# Patient Record
Sex: Female | Born: 1945
Health system: Southern US, Community
[De-identification: ages and names within clinical notes are randomized; demographics above are authoritative.]

## PROBLEM LIST (undated history)

## (undated) DIAGNOSIS — N3941 Urge incontinence: Secondary | ICD-10-CM

## (undated) DIAGNOSIS — M1990 Unspecified osteoarthritis, unspecified site: Secondary | ICD-10-CM

## (undated) DIAGNOSIS — E782 Mixed hyperlipidemia: Secondary | ICD-10-CM

## (undated) DIAGNOSIS — D649 Anemia, unspecified: Secondary | ICD-10-CM

## (undated) DIAGNOSIS — R350 Frequency of micturition: Secondary | ICD-10-CM

## (undated) DIAGNOSIS — I499 Cardiac arrhythmia, unspecified: Secondary | ICD-10-CM

## (undated) DIAGNOSIS — I809 Phlebitis and thrombophlebitis of unspecified site: Secondary | ICD-10-CM

## (undated) DIAGNOSIS — I493 Ventricular premature depolarization: Secondary | ICD-10-CM

## (undated) DIAGNOSIS — J329 Chronic sinusitis, unspecified: Secondary | ICD-10-CM

## (undated) DIAGNOSIS — E7800 Pure hypercholesterolemia, unspecified: Secondary | ICD-10-CM

## (undated) DIAGNOSIS — R5383 Other fatigue: Secondary | ICD-10-CM

## (undated) DIAGNOSIS — I498 Other specified cardiac arrhythmias: Secondary | ICD-10-CM

## (undated) DIAGNOSIS — I491 Atrial premature depolarization: Secondary | ICD-10-CM

## (undated) DIAGNOSIS — E669 Obesity, unspecified: Secondary | ICD-10-CM

## (undated) DIAGNOSIS — R002 Palpitations: Secondary | ICD-10-CM

## (undated) DIAGNOSIS — R5381 Other malaise: Secondary | ICD-10-CM

## (undated) DIAGNOSIS — I2089 Other forms of angina pectoris: Secondary | ICD-10-CM

## (undated) DIAGNOSIS — K589 Irritable bowel syndrome without diarrhea: Secondary | ICD-10-CM

## (undated) DIAGNOSIS — E785 Hyperlipidemia, unspecified: Secondary | ICD-10-CM

## (undated) DIAGNOSIS — I10 Essential (primary) hypertension: Secondary | ICD-10-CM

## (undated) DIAGNOSIS — R0600 Dyspnea, unspecified: Secondary | ICD-10-CM

## (undated) DIAGNOSIS — K219 Gastro-esophageal reflux disease without esophagitis: Secondary | ICD-10-CM

## (undated) DIAGNOSIS — I471 Supraventricular tachycardia, unspecified: Secondary | ICD-10-CM

## (undated) HISTORY — PX: BREAST SURGERY: SHX581

## (undated) HISTORY — PX: COLONOSCOPY: SHX174

## (undated) HISTORY — PX: ESOPHAGOGASTRODUODENOSCOPY: SHX1529

## (undated) HISTORY — PX: ABDOMINAL HYSTERECTOMY: SHX81

## (undated) HISTORY — PX: OTHER SURGICAL HISTORY: SHX169

## (undated) HISTORY — PX: APPENDECTOMY: SHX54

---

## 1981-07-17 HISTORY — PX: APPENDECTOMY: SHX54

## 1981-07-17 HISTORY — PX: ABDOMINAL HYSTERECTOMY: SHX81

## 2006-02-08 ENCOUNTER — Ambulatory Visit: Payer: Self-pay | Admitting: Unknown Physician Specialty

## 2006-02-13 ENCOUNTER — Ambulatory Visit: Payer: Self-pay | Admitting: Chiropractic Medicine

## 2008-06-15 ENCOUNTER — Ambulatory Visit: Payer: Self-pay | Admitting: Physician Assistant

## 2008-09-09 ENCOUNTER — Emergency Department: Payer: Self-pay | Admitting: Emergency Medicine

## 2008-10-02 ENCOUNTER — Ambulatory Visit: Payer: Self-pay | Admitting: Unknown Physician Specialty

## 2009-08-17 ENCOUNTER — Ambulatory Visit: Payer: Self-pay | Admitting: Otolaryngology

## 2010-07-19 ENCOUNTER — Ambulatory Visit: Payer: Self-pay | Admitting: Unknown Physician Specialty

## 2011-04-26 ENCOUNTER — Encounter: Payer: Self-pay | Admitting: Urology

## 2011-05-02 ENCOUNTER — Ambulatory Visit: Payer: Self-pay | Admitting: Unknown Physician Specialty

## 2011-05-18 ENCOUNTER — Encounter: Payer: Self-pay | Admitting: Urology

## 2011-06-17 ENCOUNTER — Encounter: Payer: Self-pay | Admitting: Urology

## 2011-07-18 ENCOUNTER — Encounter: Payer: Self-pay | Admitting: Urology

## 2012-04-28 ENCOUNTER — Emergency Department: Payer: Self-pay | Admitting: Emergency Medicine

## 2012-04-28 LAB — COMPREHENSIVE METABOLIC PANEL
Anion Gap: 7 (ref 7–16)
BUN: 17 mg/dL (ref 7–18)
Calcium, Total: 9 mg/dL (ref 8.5–10.1)
Chloride: 110 mmol/L — ABNORMAL HIGH (ref 98–107)
Co2: 27 mmol/L (ref 21–32)
EGFR (African American): >60
EGFR (Non-African Amer.): >60
Osmolality: 288 (ref 275–301)
Potassium: 3.5 mmol/L (ref 3.5–5.1)
SGOT(AST): 24 U/L (ref 15–37)
SGPT (ALT): 20 U/L (ref 12–78)
Sodium: 144 mmol/L (ref 136–145)

## 2012-04-28 LAB — CBC
HCT: 35.4 % (ref 35.0–47.0)
HGB: 12.1 g/dL (ref 12.0–16.0)
MCH: 28.7 pg (ref 26.0–34.0)
MCHC: 34.2 g/dL (ref 32.0–36.0)
Platelet: 223 10*3/uL (ref 150–440)
RBC: 4.22 10*6/uL (ref 3.80–5.20)

## 2012-04-28 LAB — CK TOTAL AND CKMB (NOT AT ARMC): CK-MB: <0.5 ng/mL — ABNORMAL LOW (ref 0.5–3.6)

## 2012-04-28 LAB — TROPONIN I: Troponin-I: <0.02 ng/mL

## 2012-09-06 ENCOUNTER — Ambulatory Visit: Payer: Self-pay | Admitting: Unknown Physician Specialty

## 2012-10-05 HISTORY — PX: EYE SURGERY: SHX253

## 2013-02-05 ENCOUNTER — Ambulatory Visit: Payer: Self-pay | Admitting: Otolaryngology

## 2013-02-24 DIAGNOSIS — R351 Nocturia: Secondary | ICD-10-CM | POA: Insufficient documentation

## 2013-02-24 DIAGNOSIS — R350 Frequency of micturition: Secondary | ICD-10-CM | POA: Insufficient documentation

## 2013-02-24 DIAGNOSIS — N3941 Urge incontinence: Secondary | ICD-10-CM | POA: Insufficient documentation

## 2013-07-17 DIAGNOSIS — I499 Cardiac arrhythmia, unspecified: Secondary | ICD-10-CM

## 2013-07-17 HISTORY — DX: Cardiac arrhythmia, unspecified: I49.9

## 2014-02-17 IMAGING — CT CT PARANASAL SINUSES W/O CM
1 series · 15 of 30 positions shown, 19 images · non-contrast
Comparison: none

REASON FOR EXAM: Chronic Sinusitis  Hx Diploic Bone Abnormality
COMMENTS:

[Series 2: axial facial 2.0 h70h · axial · 0.32mm/px · z∈[+1152,+1288]mm · 15 of 147 slices shown, 19 images]
[im 6/147  brain]
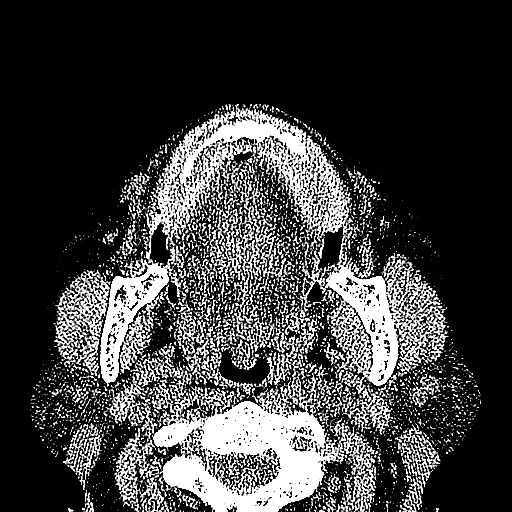
[im 6/147  bone]
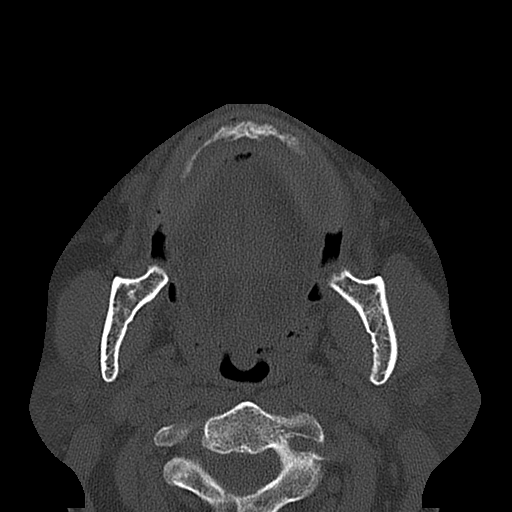
[im 16/147  bone]
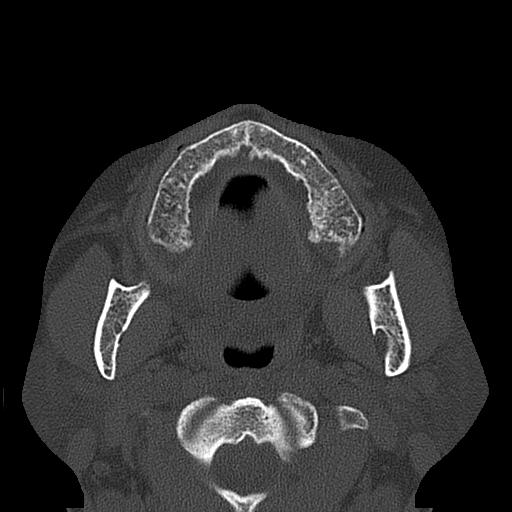
[im 26/147  bone]
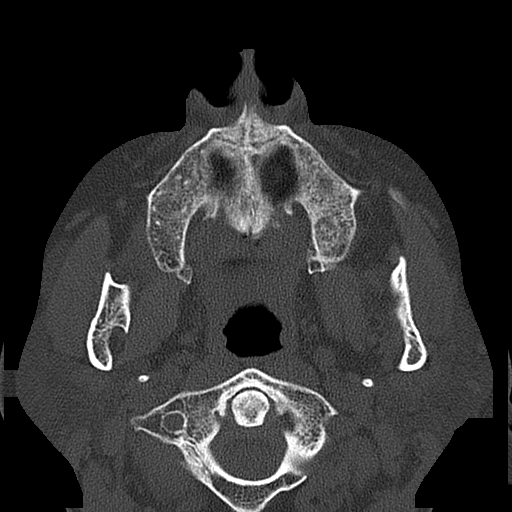
[im 36/147  bone]
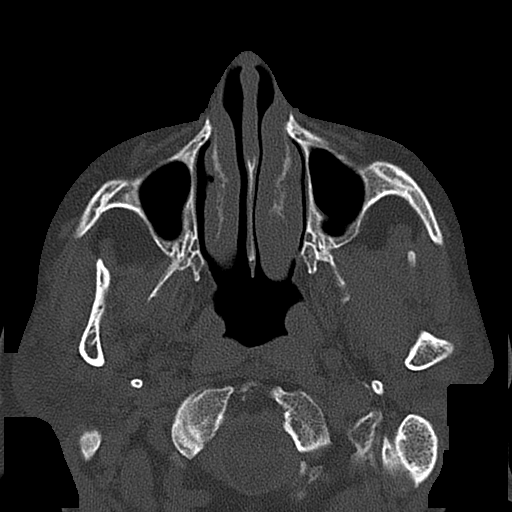
[im 46/147  brain]
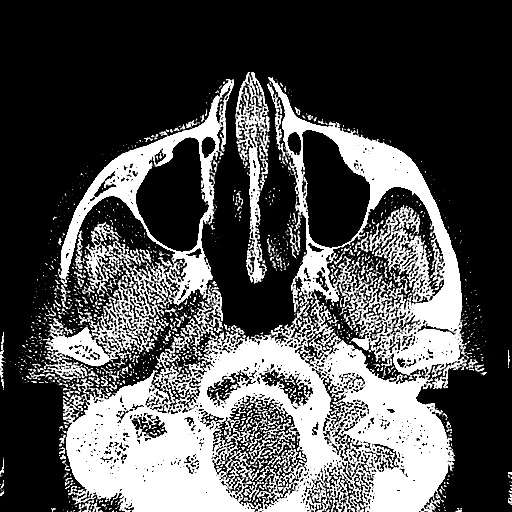
[im 46/147  bone]
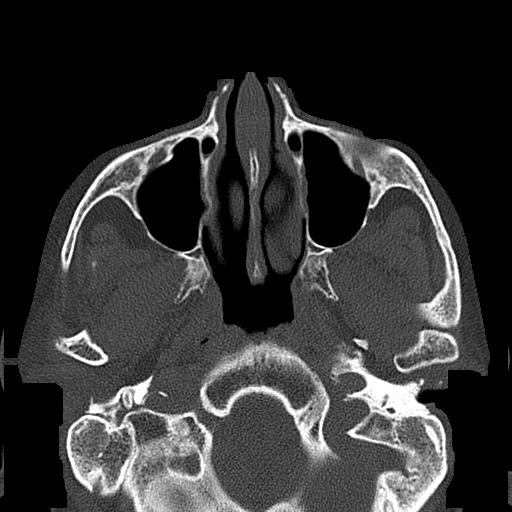
[im 56/147  bone]
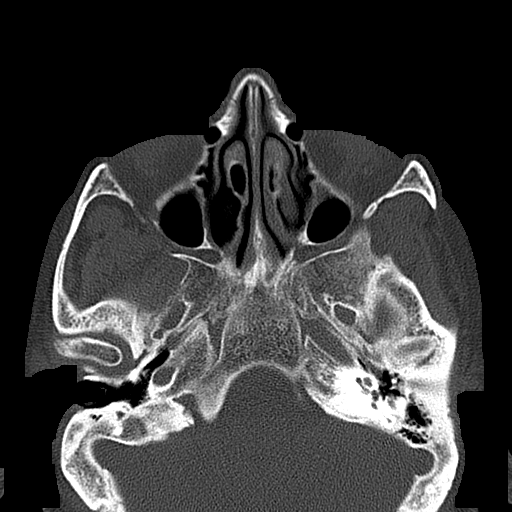
[im 66/147  bone]
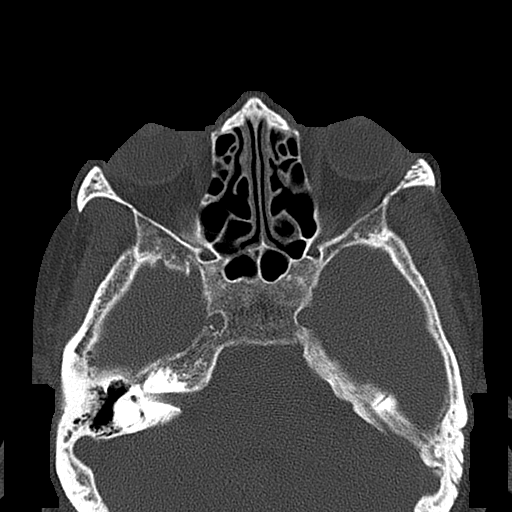
[im 76/147  bone]
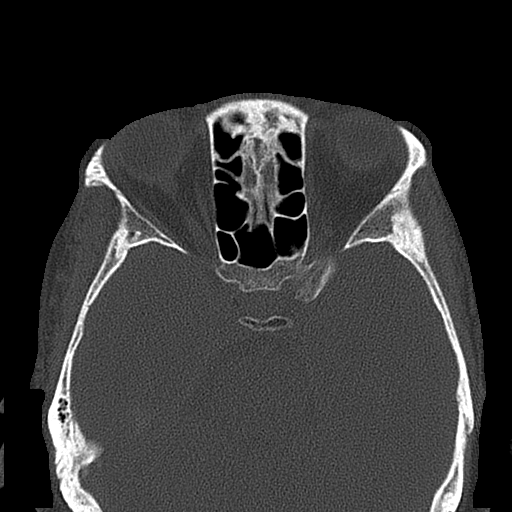
[im 81/147  brain]
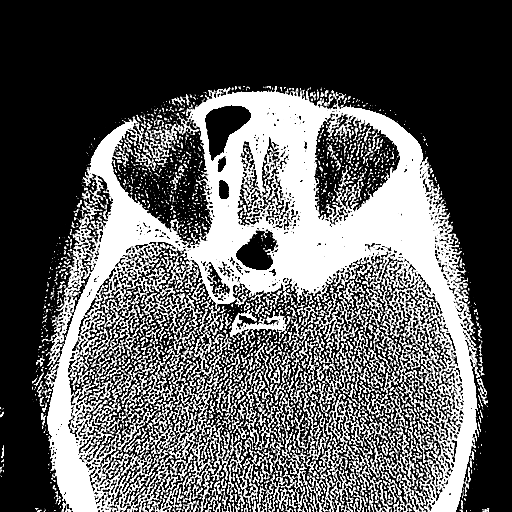
[im 81/147  bone]
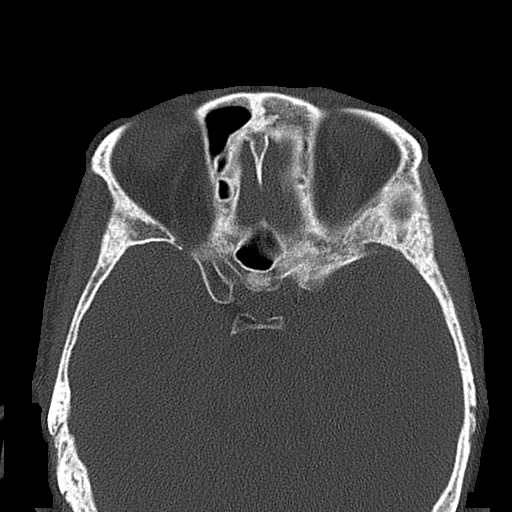
[im 91/147  bone]
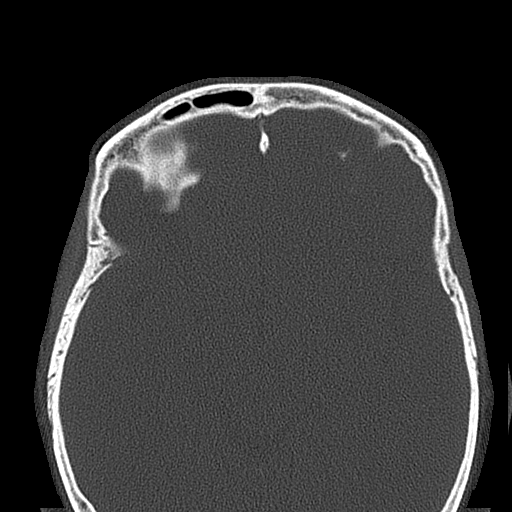
[im 101/147  bone]
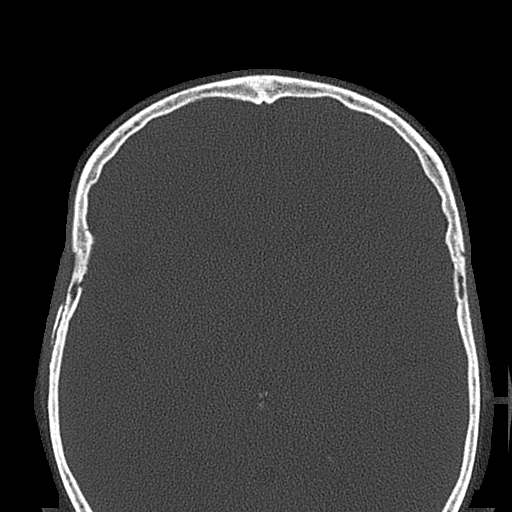
[im 111/147  bone]
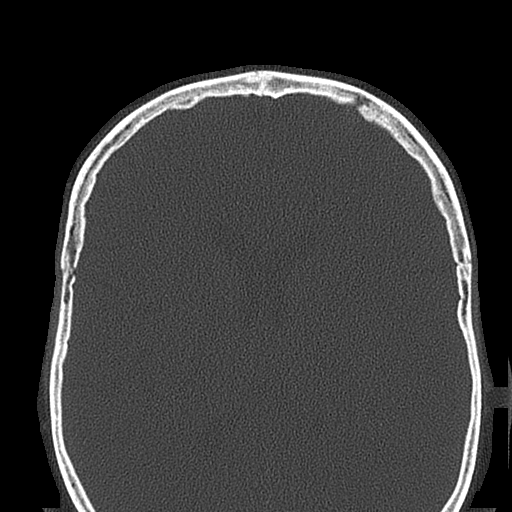
[im 121/147  brain]
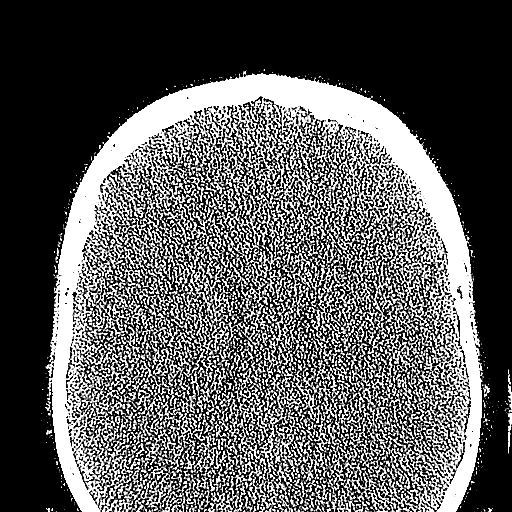
[im 121/147  bone]
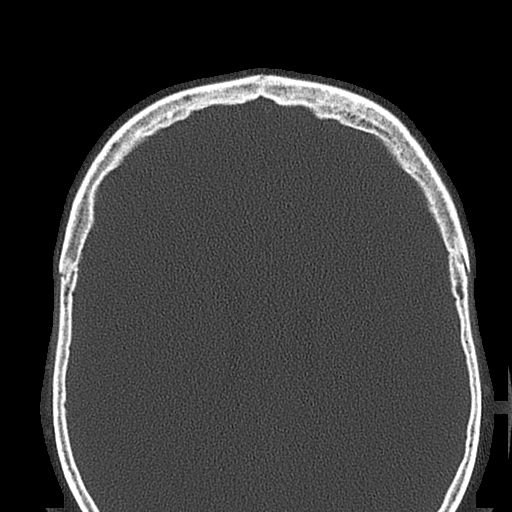
[im 131/147  bone]
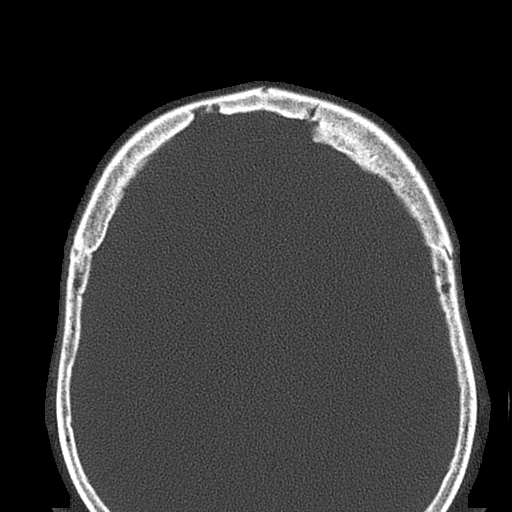
[im 141/147  bone]
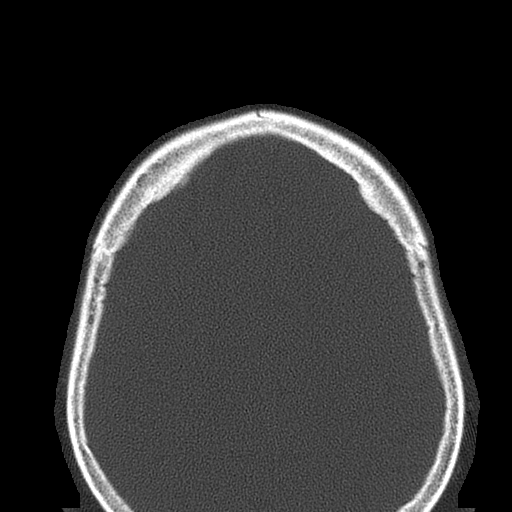

[15 of 30 positions shown; findings below may reference images not displayed]

PROCEDURE:     CT  - CT SINUSES WITHOUT CONTRAST  - February 05, 2013  [DATE]

RESULT:     Axial CT scanning was performed through the paranasal sinuses
with reconstructions at 1 mm intervals and slice thicknesses. Coronal
reconstructions were obtained as well. A bone algorithm was employed.

The left frontal sinus is hypoplastic. The right frontal sinus is clear. The
ethmoid sinuses are well pneumatized. There is a small amount of
mucoperiosteal thickening in posterior ethmoid sinus cells bilaterally. The
maxillary sinuses are clear. The ostiomeatal units are patent bilaterally. A
left sphenoid sinus cell demonstrates a tiny amount of mucoperiosteal
thickening. There are no air fluid levels.

The mastoid air cells are well developed and appear aerated bilaterally. The
middle ear cavities exhibit no abnormal soft tissue densities. There is soft
tissue density material within the right external auditory canal that likely
reflects cerumen.
IMPRESSION: 1. There are no air-fluid levels within the paranasal sinuses.
2. The ostiomeatal units of the maxillary sinuses are patent. The left
frontal sinus is hypoplastic.
3. Small amounts of mucoperiosteal thickening are present in the posterior
ethmoid sinus cells and within a left sphenoid sinus cell.
4. The mastoid air cells and middle ear cavities appear well aerated.

[REDACTED]

## 2015-08-24 DIAGNOSIS — N3281 Overactive bladder: Secondary | ICD-10-CM | POA: Insufficient documentation

## 2016-08-09 ENCOUNTER — Ambulatory Visit: Admit: 2016-08-09 | Payer: Self-pay | Admitting: Unknown Physician Specialty

## 2016-08-09 SURGERY — COLONOSCOPY WITH PROPOFOL
Anesthesia: General

## 2016-10-17 ENCOUNTER — Encounter: Payer: Self-pay | Admitting: *Deleted

## 2016-10-18 ENCOUNTER — Encounter
Admission: RE | Disposition: A | Discharge status: Home / Self Care | Payer: Self-pay | Source: Ambulatory Visit | Attending: Unknown Physician Specialty

## 2016-10-18 ENCOUNTER — Ambulatory Visit: Payer: Medicare Other | Admitting: Anesthesiology

## 2016-10-18 ENCOUNTER — Encounter: Payer: Self-pay | Admitting: Anesthesiology

## 2016-10-18 ENCOUNTER — Ambulatory Visit
Admission: RE | Admit: 2016-10-18 | Discharge: 2016-10-18 | Disposition: A | Discharge status: Home / Self Care | Payer: Medicare Other | Source: Ambulatory Visit | Attending: Unknown Physician Specialty | Admitting: Unknown Physician Specialty

## 2016-10-18 DIAGNOSIS — D122 Benign neoplasm of ascending colon: Secondary | ICD-10-CM | POA: Diagnosis not present

## 2016-10-18 DIAGNOSIS — Z6826 Body mass index (BMI) 26.0-26.9, adult: Secondary | ICD-10-CM | POA: Insufficient documentation

## 2016-10-18 DIAGNOSIS — M1990 Unspecified osteoarthritis, unspecified site: Secondary | ICD-10-CM | POA: Diagnosis not present

## 2016-10-18 DIAGNOSIS — Z8371 Family history of colonic polyps: Secondary | ICD-10-CM | POA: Diagnosis not present

## 2016-10-18 DIAGNOSIS — K219 Gastro-esophageal reflux disease without esophagitis: Secondary | ICD-10-CM | POA: Insufficient documentation

## 2016-10-18 DIAGNOSIS — D649 Anemia, unspecified: Secondary | ICD-10-CM | POA: Diagnosis not present

## 2016-10-18 DIAGNOSIS — D120 Benign neoplasm of cecum: Secondary | ICD-10-CM | POA: Insufficient documentation

## 2016-10-18 DIAGNOSIS — E669 Obesity, unspecified: Secondary | ICD-10-CM | POA: Insufficient documentation

## 2016-10-18 DIAGNOSIS — E7800 Pure hypercholesterolemia, unspecified: Secondary | ICD-10-CM | POA: Diagnosis not present

## 2016-10-18 DIAGNOSIS — Z90710 Acquired absence of both cervix and uterus: Secondary | ICD-10-CM | POA: Diagnosis not present

## 2016-10-18 DIAGNOSIS — E785 Hyperlipidemia, unspecified: Secondary | ICD-10-CM | POA: Insufficient documentation

## 2016-10-18 DIAGNOSIS — Z87891 Personal history of nicotine dependence: Secondary | ICD-10-CM | POA: Diagnosis not present

## 2016-10-18 DIAGNOSIS — Z791 Long term (current) use of non-steroidal anti-inflammatories (NSAID): Secondary | ICD-10-CM | POA: Insufficient documentation

## 2016-10-18 DIAGNOSIS — I499 Cardiac arrhythmia, unspecified: Secondary | ICD-10-CM | POA: Diagnosis not present

## 2016-10-18 DIAGNOSIS — Z1211 Encounter for screening for malignant neoplasm of colon: Secondary | ICD-10-CM | POA: Insufficient documentation

## 2016-10-18 DIAGNOSIS — K640 First degree hemorrhoids: Secondary | ICD-10-CM | POA: Insufficient documentation

## 2016-10-18 DIAGNOSIS — R002 Palpitations: Secondary | ICD-10-CM | POA: Insufficient documentation

## 2016-10-18 DIAGNOSIS — R0600 Dyspnea, unspecified: Secondary | ICD-10-CM | POA: Diagnosis not present

## 2016-10-18 DIAGNOSIS — H409 Unspecified glaucoma: Secondary | ICD-10-CM | POA: Insufficient documentation

## 2016-10-18 DIAGNOSIS — K589 Irritable bowel syndrome without diarrhea: Secondary | ICD-10-CM | POA: Insufficient documentation

## 2016-10-18 HISTORY — DX: Hyperlipidemia, unspecified: E78.5

## 2016-10-18 HISTORY — DX: Irritable bowel syndrome, unspecified: K58.9

## 2016-10-18 HISTORY — DX: Anemia, unspecified: D64.9

## 2016-10-18 HISTORY — DX: Unspecified osteoarthritis, unspecified site: M19.90

## 2016-10-18 HISTORY — DX: Chronic sinusitis, unspecified: J32.9

## 2016-10-18 HISTORY — DX: Pure hypercholesterolemia, unspecified: E78.00

## 2016-10-18 HISTORY — DX: Gastro-esophageal reflux disease without esophagitis: K21.9

## 2016-10-18 HISTORY — DX: Phlebitis and thrombophlebitis of unspecified site: I80.9

## 2016-10-18 HISTORY — DX: Other malaise: R53.81

## 2016-10-18 HISTORY — DX: Cardiac arrhythmia, unspecified: I49.9

## 2016-10-18 HISTORY — PX: COLONOSCOPY WITH PROPOFOL: SHX5780

## 2016-10-18 HISTORY — DX: Dyspnea, unspecified: R06.00

## 2016-10-18 HISTORY — DX: Obesity, unspecified: E66.9

## 2016-10-18 HISTORY — DX: Other malaise: R53.83

## 2016-10-18 SURGERY — COLONOSCOPY WITH PROPOFOL
Anesthesia: General

## 2016-10-18 MED ORDER — PROPOFOL 500 MG/50ML IV EMUL
INTRAVENOUS | Status: DC | PRN
  Administered 2016-10-18: 140 ug/kg/min via INTRAVENOUS

## 2016-10-18 MED ORDER — PROPOFOL 10 MG/ML IV BOLUS
INTRAVENOUS | Status: DC | PRN
  Administered 2016-10-18: 80 mg via INTRAVENOUS
  Administered 2016-10-18 (×2): 30 mg via INTRAVENOUS

## 2016-10-18 MED ORDER — SODIUM CHLORIDE 0.9 % IV SOLN: INTRAVENOUS | Status: DC

## 2016-10-18 MED ORDER — SODIUM CHLORIDE 0.9 % IV SOLN
INTRAVENOUS | Status: DC
  Administered 2016-10-18: 1000 mL via INTRAVENOUS

## 2016-10-18 MED ORDER — LIDOCAINE HCL (PF) 1 % IJ SOLN
2.0000 mL | Freq: Once | INTRAMUSCULAR | Status: AC
  Administered 2016-10-18: 0.3 mL via INTRADERMAL

## 2016-10-18 MED ORDER — PROPOFOL 500 MG/50ML IV EMUL
INTRAVENOUS | Status: AC
  Filled 2016-10-18: qty 50

## 2016-10-18 NOTE — Transfer of Care (Signed)
Immediate Anesthesia Transfer of Care Note  Patient: Kaitlyn Ramirez  Procedure(s) Performed: Procedure(s): COLONOSCOPY WITH PROPOFOL (N/A)  Patient Location: Endoscopy Unit  Anesthesia Type:General  Level of Consciousness: sedated  Airway & Oxygen Therapy: Patient Spontanous Breathing and Patient connected to nasal cannula oxygen  Post-op Assessment: Report given to RN and Post -op Vital signs reviewed and stable  Post vital signs: Reviewed and stable  Last Vitals:  Vitals:   10/18/16 1041  BP: 139/65  Pulse: 63  Resp: 17  Temp: (!) 35.7 C    Last Pain:  Vitals:   10/18/16 1041  TempSrc: Tympanic         Complications: No apparent anesthesia complications

## 2016-10-18 NOTE — Op Note (Signed)
[facility] Gastroenterology Patient Name: Kaitlyn Ramirez Procedure Date: 10/18/2016 11:35 AM MRN: [id number] Account #: [id number] Date of Birth: 1946/03/03 Admit Type: Outpatient Age: 71 Room: [facility] ENDO ROOM 3 Gender: Female Note Status: Finalized Procedure:            Colonoscopy Indications:          Colon cancer screening in patient at increased risk:                        Family history of 1st-degree relative with colon polyps Providers:            Manya Silvas, MD Referring MD:         Rusty Aus, MD (Referring MD) Medicines:            Propofol per Anesthesia Complications:        No immediate complications. Procedure:            Pre-Anesthesia Assessment:                       - After reviewing the risks and benefits, the patient                        was deemed in satisfactory condition to undergo the                        procedure.                       After obtaining informed consent, the colonoscope was                        passed under direct vision. Throughout the procedure,                        the patient's blood pressure, pulse, and oxygen                        saturations were monitored continuously. The                        Colonoscope was introduced through the anus and                        advanced to the the cecum, identified by appendiceal                        orifice and ileocecal valve. The colonoscopy was                        performed without difficulty. The patient tolerated the                        procedure well. The quality of the bowel preparation                        was good. Findings:      A diminutive polyp was found in the cecum. The polyp was sessile. The       polyp was removed with a cold snare. Resection and retrieval were       complete.  A diminutive polyp was found in the proximal ascending colon. The polyp       was sessile. The polyp was removed with a jumbo cold forceps. Resection        and retrieval were complete.      Internal hemorrhoids were found during endoscopy. The hemorrhoids were       small and Grade I (internal hemorrhoids that do not prolapse).      The exam was otherwise without abnormality. Impression:           - One diminutive polyp in the cecum, removed with a                        cold snare. Resected and retrieved.                       - One diminutive polyp in the proximal ascending colon,                        removed with a jumbo cold forceps. Resected and                        retrieved.                       - Internal hemorrhoids.                       - The examination was otherwise normal. Recommendation:       - Await pathology results. Manya Silvas, MD 10/18/2016 12:10:06 PM This report has been signed electronically. Number of Addenda: 0 Note Initiated On: 10/18/2016 11:35 AM Scope Withdrawal Time: 0 hours 13 minutes 33 seconds  Total Procedure Duration: 0 hours 18 minutes 23 seconds       [facility]

## 2016-10-18 NOTE — Anesthesia Preprocedure Evaluation (Signed)
Anesthesia Evaluation  Patient identified by MRN, date of birth, ID band Patient awake    Reviewed: Allergy & Precautions, H&P , NPO status , Patient's Chart, lab work & pertinent test results  History of Anesthesia Complications Negative for: history of anesthetic complications  Airway Mallampati: III  TM Distance: >3 FB Neck ROM: limited    Dental  (+) Poor Dentition, Chipped, Missing, Upper Dentures, Partial Lower   Pulmonary neg pulmonary ROS, neg shortness of breath, former smoker,    Pulmonary exam normal breath sounds clear to auscultation       Cardiovascular Exercise Tolerance: Good (-) angina(-) Past MI and (-) DOE Normal cardiovascular exam+ dysrhythmias  Rhythm:regular Rate:Normal     Neuro/Psych negative neurological ROS  negative psych ROS   GI/Hepatic negative GI ROS, Neg liver ROS, GERD  Controlled and Medicated,  Endo/Other  negative endocrine ROS  Renal/GU negative Renal ROS  negative genitourinary   Musculoskeletal  (+) Arthritis ,   Abdominal   Peds  Hematology negative hematology ROS (+)   Anesthesia Other Findings Past Medical History: No date: Anemia No date: Arthritis No date: Dyspnea 2015: Dysrhythmia     Comment: heart palpatations, irregular rhythm No date: GERD (gastroesophageal reflux disease) No date: Hypercholesteremia No date: Hyperlipidemia No date: Irritable bowel No date: Malaise and fatigue No date: Obesity No date: Phlebitis No date: Sinusitis  Past Surgical History: No date: ABDOMINAL HYSTERECTOMY No date: BREAST SURGERY     Comment: breast biopsies No date: COLONOSCOPY No date: ESOPHAGOGASTRODUODENOSCOPY 10/05/2012: EYE SURGERY Bilateral     Comment: iridectomy peripheral for glaucoma  BMI    Body Mass Index:  26.61 kg/m      Reproductive/Obstetrics negative OB ROS                             Anesthesia Physical Anesthesia  Plan  ASA: III  Anesthesia Plan: General   Post-op Pain Management:    Induction:   Airway Management Planned:   Additional Equipment:   Intra-op Plan:   Post-operative Plan:   Informed Consent: I have reviewed the patients History and Physical, chart, labs and discussed the procedure including the risks, benefits and alternatives for the proposed anesthesia with the patient or authorized representative who has indicated his/her understanding and acceptance.   Dental Advisory Given  Plan Discussed with: Anesthesiologist, CRNA and Surgeon  Anesthesia Plan Comments:         Anesthesia Quick Evaluation

## 2016-10-18 NOTE — Anesthesia Post-op Follow-up Note (Signed)
Anesthesia QCDR form completed.        

## 2016-10-18 NOTE — Anesthesia Postprocedure Evaluation (Signed)
Anesthesia Post Note  Patient: Kaitlyn Ramirez  Procedure(s) Performed: Procedure(s) (LRB): COLONOSCOPY WITH PROPOFOL (N/A)  Patient location during evaluation: Endoscopy Anesthesia Type: General Level of consciousness: awake and alert Pain management: pain level controlled Vital Signs Assessment: post-procedure vital signs reviewed and stable Respiratory status: spontaneous breathing, nonlabored ventilation, respiratory function stable and patient connected to nasal cannula oxygen Cardiovascular status: blood pressure returned to baseline and stable Postop Assessment: no signs of nausea or vomiting Anesthetic complications: no     Last Vitals:  Vitals:   10/18/16 1244 10/18/16 1254  BP: 137/83 (!) 176/69  Pulse: (!) 52 62  Resp: 16 15  Temp:      Last Pain:  Vitals:   10/18/16 1214  TempSrc: Tympanic  PainSc: Asleep                 Precious Haws Farrell Pantaleo

## 2016-10-18 NOTE — H&P (Signed)
Primary Care Physician:  Rusty Aus, MD Primary Gastroenterologist:  Dr. Vira Agar  Pre-Procedure History & Physical: HPI:  Kaitlyn Ramirez is a 71 y.o. female is here for an colonoscopy.   Past Medical History:  Diagnosis Date  . Anemia   . Arthritis   . Dyspnea   . Dysrhythmia 2015   heart palpatations, irregular rhythm  . GERD (gastroesophageal reflux disease)   . Hypercholesteremia   . Hyperlipidemia   . Irritable bowel   . Malaise and fatigue   . Obesity   . Phlebitis   . Sinusitis     Past Surgical History:  Procedure Laterality Date  . ABDOMINAL HYSTERECTOMY    . BREAST SURGERY     breast biopsies  . COLONOSCOPY    . ESOPHAGOGASTRODUODENOSCOPY    . EYE SURGERY Bilateral 10/05/2012   iridectomy peripheral for glaucoma    Prior to Admission medications   Medication Sig Start Date End Date Taking? Authorizing Provider  Artificial Tear Solution (TEARS NATURALE FORTE OP) Apply 1 drop to eye every 4 (four) hours as needed (for dry eyes).   Yes Historical Provider, MD  calcium-vitamin D 250-100 MG-UNIT tablet Take 1 tablet by mouth 2 (two) times daily.   Yes Historical Provider, MD  ibuprofen (ADVIL,MOTRIN) 200 MG tablet Take 400 mg by mouth every evening.   Yes Historical Provider, MD  LACTOBACILLUS RHAMNOSUS, GG, PO Take by mouth daily.   Yes Historical Provider, MD  latanoprost (XALATAN) 0.005 % ophthalmic solution Place 1 drop into both eyes at bedtime.   Yes Historical Provider, MD  loratadine (CLARITIN) 10 MG tablet Take 10 mg by mouth daily.   Yes Historical Provider, MD  montelukast (SINGULAIR) 10 MG tablet Take 10 mg by mouth daily.   Yes Historical Provider, MD  OMEGA-3 FATTY ACIDS PO Take 1 capsule by mouth daily. 340-1,000mg    Yes Historical Provider, MD  omeprazole (PRILOSEC) 40 MG capsule Take 40 mg by mouth daily.   Yes Historical Provider, MD  pravastatin (PRAVACHOL) 10 MG tablet Take 10 mg by mouth every evening.   Yes Historical Provider, MD     Allergies as of 09/07/2016  . (Not on File)    History reviewed. No pertinent family history.  Social History   Social History  . Marital status: Married    Spouse name: N/A  . Number of children: N/A  . Years of education: N/A   Occupational History  . Not on file.   Social History Main Topics  . Smoking status: Former Research scientist (life sciences)  . Smokeless tobacco: Never Used  . Alcohol use No  . Drug use: No  . Sexual activity: Not on file   Other Topics Concern  . Not on file   Social History Narrative  . No narrative on file    Review of Systems: See HPI, otherwise negative ROS  Physical Exam: BP 139/65   Pulse 63   Temp (!) 96.3 F (35.7 C) (Tympanic)   Resp 17   Ht 5\' 8"  (1.727 m)   Wt 79.4 kg (175 lb)   SpO2 100%   BMI 26.61 kg/m  General:   Alert,  pleasant and cooperative in NAD Head:  Normocephalic and atraumatic. Neck:  Supple; no masses or thyromegaly. Lungs:  Clear throughout to auscultation.    Heart:  Regular rate and rhythm. Abdomen:  Soft, nontender and nondistended. Normal bowel sounds, without guarding, and without rebound.   Neurologic:  Alert and  oriented x4;  grossly normal  neurologically.  Impression/Plan: Kaitlyn Ramirez is here for an colonoscopy to be performed for FH colon polyps  Risks, benefits, limitations, and alternatives regarding  colonoscopy have been reviewed with the patient.  Questions have been answered.  All parties agreeable.   Gaylyn Cheers, MD  10/18/2016, 11:41 AM

## 2016-10-19 ENCOUNTER — Encounter: Payer: Self-pay | Admitting: Unknown Physician Specialty

## 2016-10-19 LAB — SURGICAL PATHOLOGY

## 2016-12-25 DIAGNOSIS — D369 Benign neoplasm, unspecified site: Secondary | ICD-10-CM | POA: Insufficient documentation

## 2017-08-16 DIAGNOSIS — R152 Fecal urgency: Secondary | ICD-10-CM | POA: Diagnosis not present

## 2017-08-16 DIAGNOSIS — R159 Full incontinence of feces: Secondary | ICD-10-CM | POA: Diagnosis not present

## 2017-08-16 DIAGNOSIS — N3281 Overactive bladder: Secondary | ICD-10-CM | POA: Diagnosis not present

## 2017-09-03 DIAGNOSIS — R5381 Other malaise: Secondary | ICD-10-CM | POA: Diagnosis not present

## 2017-09-03 DIAGNOSIS — J329 Chronic sinusitis, unspecified: Secondary | ICD-10-CM | POA: Diagnosis not present

## 2017-09-03 DIAGNOSIS — K219 Gastro-esophageal reflux disease without esophagitis: Secondary | ICD-10-CM | POA: Diagnosis not present

## 2017-09-03 DIAGNOSIS — R0602 Shortness of breath: Secondary | ICD-10-CM | POA: Diagnosis not present

## 2017-09-03 DIAGNOSIS — R011 Cardiac murmur, unspecified: Secondary | ICD-10-CM | POA: Diagnosis not present

## 2017-09-03 DIAGNOSIS — R002 Palpitations: Secondary | ICD-10-CM | POA: Diagnosis not present

## 2017-09-03 DIAGNOSIS — I208 Other forms of angina pectoris: Secondary | ICD-10-CM | POA: Diagnosis not present

## 2017-09-03 DIAGNOSIS — E785 Hyperlipidemia, unspecified: Secondary | ICD-10-CM | POA: Diagnosis not present

## 2017-09-03 DIAGNOSIS — R5383 Other fatigue: Secondary | ICD-10-CM | POA: Diagnosis not present

## 2017-09-18 DIAGNOSIS — H04123 Dry eye syndrome of bilateral lacrimal glands: Secondary | ICD-10-CM | POA: Diagnosis not present

## 2017-09-18 DIAGNOSIS — H2513 Age-related nuclear cataract, bilateral: Secondary | ICD-10-CM | POA: Diagnosis not present

## 2017-09-18 DIAGNOSIS — H40033 Anatomical narrow angle, bilateral: Secondary | ICD-10-CM | POA: Diagnosis not present

## 2017-09-28 DIAGNOSIS — D171 Benign lipomatous neoplasm of skin and subcutaneous tissue of trunk: Secondary | ICD-10-CM | POA: Diagnosis not present

## 2017-09-28 DIAGNOSIS — L239 Allergic contact dermatitis, unspecified cause: Secondary | ICD-10-CM | POA: Diagnosis not present

## 2017-10-08 DIAGNOSIS — R011 Cardiac murmur, unspecified: Secondary | ICD-10-CM | POA: Diagnosis not present

## 2017-10-08 DIAGNOSIS — I208 Other forms of angina pectoris: Secondary | ICD-10-CM | POA: Diagnosis not present

## 2017-10-12 DIAGNOSIS — Z23 Encounter for immunization: Secondary | ICD-10-CM | POA: Diagnosis not present

## 2017-10-16 DIAGNOSIS — I10 Essential (primary) hypertension: Secondary | ICD-10-CM | POA: Diagnosis not present

## 2017-10-24 DIAGNOSIS — Z1231 Encounter for screening mammogram for malignant neoplasm of breast: Secondary | ICD-10-CM | POA: Diagnosis not present

## 2017-10-24 DIAGNOSIS — Z124 Encounter for screening for malignant neoplasm of cervix: Secondary | ICD-10-CM | POA: Diagnosis not present

## 2017-12-27 DIAGNOSIS — D225 Melanocytic nevi of trunk: Secondary | ICD-10-CM | POA: Diagnosis not present

## 2017-12-27 DIAGNOSIS — E782 Mixed hyperlipidemia: Secondary | ICD-10-CM | POA: Diagnosis not present

## 2017-12-27 DIAGNOSIS — D171 Benign lipomatous neoplasm of skin and subcutaneous tissue of trunk: Secondary | ICD-10-CM | POA: Diagnosis not present

## 2018-01-07 DIAGNOSIS — Z0000 Encounter for general adult medical examination without abnormal findings: Secondary | ICD-10-CM | POA: Diagnosis not present

## 2018-01-07 DIAGNOSIS — I10 Essential (primary) hypertension: Secondary | ICD-10-CM | POA: Diagnosis not present

## 2018-01-07 DIAGNOSIS — E782 Mixed hyperlipidemia: Secondary | ICD-10-CM | POA: Diagnosis not present

## 2018-01-07 DIAGNOSIS — Z79899 Other long term (current) drug therapy: Secondary | ICD-10-CM | POA: Diagnosis not present

## 2018-01-07 DIAGNOSIS — D500 Iron deficiency anemia secondary to blood loss (chronic): Secondary | ICD-10-CM | POA: Diagnosis not present

## 2018-01-19 DIAGNOSIS — H6980 Other specified disorders of Eustachian tube, unspecified ear: Secondary | ICD-10-CM | POA: Diagnosis not present

## 2018-01-19 DIAGNOSIS — D500 Iron deficiency anemia secondary to blood loss (chronic): Secondary | ICD-10-CM | POA: Diagnosis not present

## 2018-01-21 DIAGNOSIS — D500 Iron deficiency anemia secondary to blood loss (chronic): Secondary | ICD-10-CM | POA: Diagnosis not present

## 2018-02-04 DIAGNOSIS — E782 Mixed hyperlipidemia: Secondary | ICD-10-CM | POA: Diagnosis not present

## 2018-02-04 DIAGNOSIS — Z79899 Other long term (current) drug therapy: Secondary | ICD-10-CM | POA: Diagnosis not present

## 2018-02-04 DIAGNOSIS — D500 Iron deficiency anemia secondary to blood loss (chronic): Secondary | ICD-10-CM | POA: Diagnosis not present

## 2018-02-05 DIAGNOSIS — H04123 Dry eye syndrome of bilateral lacrimal glands: Secondary | ICD-10-CM | POA: Diagnosis not present

## 2018-02-05 DIAGNOSIS — H40033 Anatomical narrow angle, bilateral: Secondary | ICD-10-CM | POA: Diagnosis not present

## 2018-02-05 DIAGNOSIS — H2513 Age-related nuclear cataract, bilateral: Secondary | ICD-10-CM | POA: Diagnosis not present

## 2018-02-06 DIAGNOSIS — D171 Benign lipomatous neoplasm of skin and subcutaneous tissue of trunk: Secondary | ICD-10-CM | POA: Diagnosis not present

## 2018-02-06 DIAGNOSIS — E782 Mixed hyperlipidemia: Secondary | ICD-10-CM | POA: Diagnosis not present

## 2018-02-06 DIAGNOSIS — I10 Essential (primary) hypertension: Secondary | ICD-10-CM | POA: Diagnosis not present

## 2018-02-06 DIAGNOSIS — Z79899 Other long term (current) drug therapy: Secondary | ICD-10-CM | POA: Diagnosis not present

## 2018-02-12 DIAGNOSIS — D171 Benign lipomatous neoplasm of skin and subcutaneous tissue of trunk: Secondary | ICD-10-CM | POA: Diagnosis not present

## 2018-02-20 ENCOUNTER — Encounter: Payer: Self-pay | Admitting: *Deleted

## 2018-02-26 ENCOUNTER — Ambulatory Visit: Payer: Self-pay | Admitting: General Surgery

## 2018-03-20 DIAGNOSIS — R070 Pain in throat: Secondary | ICD-10-CM | POA: Diagnosis not present

## 2018-03-20 DIAGNOSIS — J309 Allergic rhinitis, unspecified: Secondary | ICD-10-CM | POA: Diagnosis not present

## 2018-03-20 DIAGNOSIS — H6980 Other specified disorders of Eustachian tube, unspecified ear: Secondary | ICD-10-CM | POA: Diagnosis not present

## 2018-03-22 DIAGNOSIS — J0100 Acute maxillary sinusitis, unspecified: Secondary | ICD-10-CM | POA: Diagnosis not present

## 2018-03-22 DIAGNOSIS — R600 Localized edema: Secondary | ICD-10-CM | POA: Diagnosis not present

## 2018-04-10 DIAGNOSIS — Z1231 Encounter for screening mammogram for malignant neoplasm of breast: Secondary | ICD-10-CM | POA: Diagnosis not present

## 2018-04-24 ENCOUNTER — Encounter: Payer: Self-pay | Admitting: *Deleted

## 2018-04-25 ENCOUNTER — Ambulatory Visit: Payer: Medicare HMO | Admitting: General Surgery

## 2018-05-02 DIAGNOSIS — Z23 Encounter for immunization: Secondary | ICD-10-CM | POA: Diagnosis not present

## 2018-06-25 DIAGNOSIS — H40033 Anatomical narrow angle, bilateral: Secondary | ICD-10-CM | POA: Diagnosis not present

## 2018-06-25 DIAGNOSIS — H2513 Age-related nuclear cataract, bilateral: Secondary | ICD-10-CM | POA: Diagnosis not present

## 2018-06-25 DIAGNOSIS — H04123 Dry eye syndrome of bilateral lacrimal glands: Secondary | ICD-10-CM | POA: Diagnosis not present

## 2018-07-18 ENCOUNTER — Encounter: Payer: Self-pay | Admitting: *Deleted

## 2018-08-06 DIAGNOSIS — Z79899 Other long term (current) drug therapy: Secondary | ICD-10-CM | POA: Diagnosis not present

## 2018-08-06 DIAGNOSIS — E782 Mixed hyperlipidemia: Secondary | ICD-10-CM | POA: Diagnosis not present

## 2018-08-13 DIAGNOSIS — Z0000 Encounter for general adult medical examination without abnormal findings: Secondary | ICD-10-CM | POA: Diagnosis not present

## 2018-08-13 DIAGNOSIS — K219 Gastro-esophageal reflux disease without esophagitis: Secondary | ICD-10-CM | POA: Diagnosis not present

## 2018-08-13 DIAGNOSIS — E782 Mixed hyperlipidemia: Secondary | ICD-10-CM | POA: Diagnosis not present

## 2018-08-20 DIAGNOSIS — R011 Cardiac murmur, unspecified: Secondary | ICD-10-CM | POA: Diagnosis not present

## 2018-08-20 DIAGNOSIS — R5383 Other fatigue: Secondary | ICD-10-CM | POA: Diagnosis not present

## 2018-08-20 DIAGNOSIS — R5381 Other malaise: Secondary | ICD-10-CM | POA: Diagnosis not present

## 2018-08-20 DIAGNOSIS — J329 Chronic sinusitis, unspecified: Secondary | ICD-10-CM | POA: Diagnosis not present

## 2018-08-20 DIAGNOSIS — R0602 Shortness of breath: Secondary | ICD-10-CM | POA: Diagnosis not present

## 2018-08-20 DIAGNOSIS — R002 Palpitations: Secondary | ICD-10-CM | POA: Diagnosis not present

## 2018-08-20 DIAGNOSIS — E785 Hyperlipidemia, unspecified: Secondary | ICD-10-CM | POA: Diagnosis not present

## 2018-08-20 DIAGNOSIS — I208 Other forms of angina pectoris: Secondary | ICD-10-CM | POA: Diagnosis not present

## 2018-08-20 DIAGNOSIS — K219 Gastro-esophageal reflux disease without esophagitis: Secondary | ICD-10-CM | POA: Diagnosis not present

## 2018-09-05 ENCOUNTER — Ambulatory Visit: Payer: Medicare HMO | Admitting: General Surgery

## 2018-09-05 ENCOUNTER — Encounter: Payer: Self-pay | Admitting: General Surgery

## 2018-09-05 ENCOUNTER — Other Ambulatory Visit: Payer: Self-pay

## 2018-09-05 VITALS — BP 150/81 | HR 74 | Temp 97.7°F | Ht 68.0 in | Wt 171.0 lb

## 2018-09-05 DIAGNOSIS — D171 Benign lipomatous neoplasm of skin and subcutaneous tissue of trunk: Secondary | ICD-10-CM | POA: Diagnosis not present

## 2018-09-05 NOTE — Progress Notes (Signed)
Patient ID: Kaitlyn Ramirez, female   DOB: 16-Nov-1945, 73 y.o.   MRN: [id number]  Chief Complaint  Patient presents with  . Lipoma     eval lipoma on back    HPI Kaitlyn Ramirez is a 73 y.o. female here today for lipoma of the back patient states it is not painful. States that her bra strap rubs up against it and it is bothersome, she has had it removed before many years ago. Patient states she is feeling well otherwise.   HPI  Past Medical History:  Diagnosis Date  . Anemia   . Arthritis   . Dyspnea   . Dysrhythmia 2015   heart palpatations, irregular rhythm  . GERD (gastroesophageal reflux disease)   . Hypercholesteremia   . Hyperlipidemia   . Irritable bowel   . Malaise and fatigue   . Obesity   . Phlebitis   . Sinusitis     Past Surgical History:  Procedure Laterality Date  . ABDOMINAL HYSTERECTOMY    . BREAST SURGERY     breast biopsies  . COLONOSCOPY    . COLONOSCOPY WITH PROPOFOL N/A 10/18/2016   Procedure: COLONOSCOPY WITH PROPOFOL;  Surgeon: Manya Silvas, MD;  Location: [facility] ENDOSCOPY;  Service: Endoscopy;  Laterality: N/A;  . ESOPHAGOGASTRODUODENOSCOPY    . EYE SURGERY Bilateral 10/05/2012   iridectomy peripheral for glaucoma    Family History  Problem Relation Age of Onset  . Cancer Mother     Social History Social History   Tobacco Use  . Smoking status: Former Research scientist (life sciences)  . Smokeless tobacco: Never Used  Substance Use Topics  . Alcohol use: No  . Drug use: No    Allergies  Allergen Reactions  . Amoxicillin Nausea And Vomiting  . Sulfa Antibiotics     Current Outpatient Medications  Medication Sig Dispense Refill  . amLODipine (NORVASC) 5 MG tablet     . Artificial Tear Solution (TEARS NATURALE FORTE OP) Apply 1 drop to eye every 4 (four) hours as needed (for dry eyes).    . Calcium Carbonate-Vitamin D (CALCIUM HIGH POTENCY/VITAMIN D) 600-200 MG-UNIT TABS Take by mouth.    . calcium-vitamin D 250-100 MG-UNIT tablet Take 1 tablet by mouth 2  (two) times daily.    . cefUROXime (CEFTIN) 250 MG tablet     . ibuprofen (ADVIL,MOTRIN) 200 MG tablet Take 400 mg by mouth every evening.    Marland Kitchen LACTOBACILLUS RHAMNOSUS, GG, PO Take by mouth daily.    Marland Kitchen latanoprost (XALATAN) 0.005 % ophthalmic solution Place 1 drop into both eyes at bedtime.    Marland Kitchen loratadine (CLARITIN) 10 MG tablet Take 10 mg by mouth daily.    . OMEGA-3 FATTY ACIDS PO Take 1 capsule by mouth daily. 340-1,000mg     . omeprazole (PRILOSEC) 40 MG capsule Take 40 mg by mouth daily.    . pravastatin (PRAVACHOL) 10 MG tablet Take 10 mg by mouth every evening.     No current facility-administered medications for this visit.     Review of Systems Review of Systems  Constitutional: Negative.   Respiratory: Negative.   Cardiovascular: Negative.     Blood pressure (!) 150/81, pulse 74, temperature 97.7 F (36.5 C), temperature source Temporal, height 5\' 8"  (1.727 m), weight 171 lb (77.6 kg), SpO2 94 %.  Physical Exam Physical Exam Constitutional:      Appearance: She is well-developed.  Eyes:     General: No scleral icterus.    Conjunctiva/sclera: Conjunctivae normal.  Neck:  Musculoskeletal: Normal range of motion.  Cardiovascular:     Rate and Rhythm: Normal rate and regular rhythm.     Heart sounds: Normal heart sounds.  Pulmonary:     Effort: Pulmonary effort is normal.     Breath sounds: Normal breath sounds.    Chest:     Breasts: Breasts are symmetrical.        Right: No inverted nipple, mass, nipple discharge, skin change or tenderness.        Left: No inverted nipple, mass, nipple discharge, skin change or tenderness.  Lymphadenopathy:     Cervical: No cervical adenopathy.  Skin:    General: Skin is warm and dry.  Neurological:     Mental Status: She is alert and oriented to person, place, and time.     Data Reviewed The patient reports this last was excised in 2013 and recurred within the last 12 months.  Records from that visit were not  available today.  Records from her 2005 excision of a 2 cm mass with pathology showing a simple lipoma were available.  Office notes from that date reference a prior excision in 2002.  Paper charts from that date are no longer available.  Assessment Recurrent lipoma of the right back, irritating to the location along the paraspinal muscle and aggravated by the band of her bra.   Plan  At this time, we will make arrangements to resect this at a convenient date as an office procedure.  HPI, Physical Exam, Assessment and Plan have been scribed under the direction and in the presence of Hervey Ard, Md.  Eudelia Bunch R. Bobette Mo, CMA Marlane Hatcher 09/05/2018, 2:08 PM

## 2018-09-05 NOTE — Patient Instructions (Addendum)
Patient will need to be scheduled to have lipoma of the back removed.   Call the office with any questions or concerns.

## 2018-09-19 ENCOUNTER — Other Ambulatory Visit: Payer: Self-pay

## 2018-09-19 ENCOUNTER — Encounter: Payer: Self-pay | Admitting: General Surgery

## 2018-09-19 ENCOUNTER — Ambulatory Visit: Payer: Medicare HMO | Admitting: General Surgery

## 2018-09-19 VITALS — BP 148/81 | HR 75 | Temp 97.3°F | Resp 16 | Ht 68.0 in | Wt 169.2 lb

## 2018-09-19 DIAGNOSIS — D171 Benign lipomatous neoplasm of skin and subcutaneous tissue of trunk: Secondary | ICD-10-CM

## 2018-09-19 HISTORY — PX: LIPOMA EXCISION: SHX5283

## 2018-09-19 NOTE — Patient Instructions (Signed)
May shower May remove dressing in 2-3 days Steri strips will gradually come off over 2-3 weeks May use an Ice pack as needed for comfort  

## 2018-09-19 NOTE — Progress Notes (Signed)
Patient ID: Kaitlyn Ramirez, female   DOB: 03-Nov-1945, 73 y.o.   MRN: [id number]  Chief Complaint  Patient presents with  . Procedure    HPI NANEA Kaitlyn Ramirez is a 73 y.o. female.  Here for excision back mass.  HPI  Past Medical History:  Diagnosis Date  . Anemia   . Arthritis   . Dyspnea   . Dysrhythmia 2015   heart palpatations, irregular rhythm  . GERD (gastroesophageal reflux disease)   . Hypercholesteremia   . Hyperlipidemia   . Irritable bowel   . Malaise and fatigue   . Obesity   . Phlebitis   . Sinusitis     Past Surgical History:  Procedure Laterality Date  . ABDOMINAL HYSTERECTOMY    . BREAST SURGERY     breast biopsies  . COLONOSCOPY    . COLONOSCOPY WITH PROPOFOL N/A 10/18/2016   Procedure: COLONOSCOPY WITH PROPOFOL;  Surgeon: Manya Silvas, MD;  Location: [facility] ENDOSCOPY;  Service: Endoscopy;  Laterality: N/A;  . ESOPHAGOGASTRODUODENOSCOPY    . EYE SURGERY Bilateral 10/05/2012   iridectomy peripheral for glaucoma  . LIPOMA EXCISION  09/19/2018   Back    Family History  Problem Relation Age of Onset  . Cancer Mother     Social History Social History   Tobacco Use  . Smoking status: Former Research scientist (life sciences)  . Smokeless tobacco: Never Used  Substance Use Topics  . Alcohol use: No  . Drug use: No    Allergies  Allergen Reactions  . Amoxicillin Nausea And Vomiting  . Sulfa Antibiotics     Current Outpatient Medications  Medication Sig Dispense Refill  . amLODipine (NORVASC) 5 MG tablet     . Artificial Tear Solution (TEARS NATURALE FORTE OP) Apply 1 drop to eye every 4 (four) hours as needed (for dry eyes).    . Calcium Carbonate-Vitamin D (CALCIUM HIGH POTENCY/VITAMIN D) 600-200 MG-UNIT TABS Take by mouth.    . calcium-vitamin D 250-100 MG-UNIT tablet Take 1 tablet by mouth 2 (two) times daily.    . cefUROXime (CEFTIN) 250 MG tablet     . ibuprofen (ADVIL,MOTRIN) 200 MG tablet Take 400 mg by mouth every evening.    Marland Kitchen LACTOBACILLUS RHAMNOSUS, GG, PO  Take by mouth daily.    Marland Kitchen latanoprost (XALATAN) 0.005 % ophthalmic solution Place 1 drop into both eyes at bedtime.    Marland Kitchen loratadine (CLARITIN) 10 MG tablet Take 10 mg by mouth daily.    . OMEGA-3 FATTY ACIDS PO Take 1 capsule by mouth daily. 340-1,000mg     . omeprazole (PRILOSEC) 40 MG capsule Take 40 mg by mouth daily.    . pravastatin (PRAVACHOL) 10 MG tablet Take 10 mg by mouth every evening.     No current facility-administered medications for this visit.     Review of Systems Review of Systems  Constitutional: Negative.   Respiratory: Negative.   Cardiovascular: Negative.     Blood pressure (!) 148/81, pulse 75, temperature (!) 97.3 F (36.3 C), temperature source Skin, resp. rate 16, height 5\' 8"  (1.727 m), weight 169 lb 3.2 oz (76.7 kg), SpO2 97 %.  Physical Exam Physical Exam Constitutional:      Appearance: Normal appearance.  Pulmonary:    Skin:    General: Skin is warm and dry.  Neurological:     Mental Status: She is alert and oriented to person, place, and time.  Psychiatric:        Behavior: Behavior normal.     Data  Reviewed The procedure was reviewed and she was amenable to proceed. 20 cc of 0.%% xylocaine with 0.25 % marcaine with 1: 200,000 units of epinephrine which was well tolerated. The area was excised through a transverse incision. The dissection was carried down to but did not violate the deep fascia.  Deep tissue was approximated  With 3-0 vicryl figure of eight sutures.  The skin was approximated with a running subcuticular suture.  Benzoin and steristrips followed by Telfa and Tegaderm applied.   Assessment Recurrent lipoma of the back.   Plan Tylenol for discomfort.  Ice for comfort. You will be called with pathology when available.     HPI, assessment, plan and physical exam has been scribed under the direction and in the presence of Robert Bellow, MD. Karie Fetch, RN  I have completed the exam and reviewed the above  documentation for accuracy and completeness.  I agree with the above.  Haematologist has been used and any errors in dictation or transcription are unintentional.  Hervey Ard, M.D., F.A.C.S.   Forest Gleason Mariyam Remington 09/19/2018, 9:11 PM

## 2018-09-23 ENCOUNTER — Telehealth: Payer: Self-pay

## 2018-09-23 NOTE — Telephone Encounter (Signed)
-----   Message from Robert Bellow, MD sent at 09/23/2018 11:38 AM EDT ----- Please notify the patient that the pathology showed fatty tissue as expected. Thanks ----- Message ----- From: Interface, Lab In [facility] Sent: 09/20/2018   5:37 PM EDT To: Robert Bellow, MD

## 2018-09-23 NOTE — Progress Notes (Signed)
Patient has been informed of her pathology results per Dr.Brynett.

## 2018-12-31 DIAGNOSIS — H40033 Anatomical narrow angle, bilateral: Secondary | ICD-10-CM | POA: Diagnosis not present

## 2018-12-31 DIAGNOSIS — H2513 Age-related nuclear cataract, bilateral: Secondary | ICD-10-CM | POA: Diagnosis not present

## 2018-12-31 DIAGNOSIS — H04123 Dry eye syndrome of bilateral lacrimal glands: Secondary | ICD-10-CM | POA: Diagnosis not present

## 2019-01-14 DIAGNOSIS — Z20828 Contact with and (suspected) exposure to other viral communicable diseases: Secondary | ICD-10-CM | POA: Diagnosis not present

## 2019-02-06 DIAGNOSIS — E782 Mixed hyperlipidemia: Secondary | ICD-10-CM | POA: Diagnosis not present

## 2019-02-13 DIAGNOSIS — Z79899 Other long term (current) drug therapy: Secondary | ICD-10-CM | POA: Diagnosis not present

## 2019-02-13 DIAGNOSIS — E782 Mixed hyperlipidemia: Secondary | ICD-10-CM | POA: Diagnosis not present

## 2019-02-13 DIAGNOSIS — I493 Ventricular premature depolarization: Secondary | ICD-10-CM | POA: Diagnosis not present

## 2019-02-26 ENCOUNTER — Telehealth: Payer: Self-pay | Admitting: *Deleted

## 2019-02-26 NOTE — Telephone Encounter (Signed)
Patient called the office wanting to get a print out of record showing that she made a $45.00 co-pay from [place] 09-05-18 and 09-19-18.   The patient would like this to be mailed to her.   Note routed to Angie to please print and mail since I don't have the access to do so.

## 2019-04-16 DIAGNOSIS — Z1231 Encounter for screening mammogram for malignant neoplasm of breast: Secondary | ICD-10-CM | POA: Diagnosis not present

## 2019-05-02 DIAGNOSIS — Z23 Encounter for immunization: Secondary | ICD-10-CM | POA: Diagnosis not present

## 2019-07-30 DIAGNOSIS — M1611 Unilateral primary osteoarthritis, right hip: Secondary | ICD-10-CM | POA: Diagnosis not present

## 2019-07-30 DIAGNOSIS — I10 Essential (primary) hypertension: Secondary | ICD-10-CM | POA: Diagnosis not present

## 2019-07-30 DIAGNOSIS — Z87891 Personal history of nicotine dependence: Secondary | ICD-10-CM | POA: Diagnosis not present

## 2019-08-14 ENCOUNTER — Ambulatory Visit: Payer: Self-pay

## 2019-08-14 DIAGNOSIS — E782 Mixed hyperlipidemia: Secondary | ICD-10-CM | POA: Diagnosis not present

## 2019-08-21 ENCOUNTER — Ambulatory Visit: Payer: Medicare HMO | Attending: Internal Medicine

## 2019-08-21 DIAGNOSIS — Z23 Encounter for immunization: Secondary | ICD-10-CM

## 2019-08-21 NOTE — Progress Notes (Signed)
   Covid-19 Vaccination Clinic  Name:  Kaitlyn Ramirez    MRN: [id number] DOB: 1946/07/14  08/21/2019  [name] was observed post Covid-19 immunization for 15 minutes without incidence. She was provided with Vaccine Information Sheet and instruction to access the V-Safe system.   [name] was instructed to call 911 with any severe reactions post vaccine: Marland Kitchen Difficulty breathing  . Swelling of your face and throat  . A fast heartbeat  . A bad rash all over your body  . Dizziness and weakness    Immunizations Administered    Name Date Dose VIS Date Route   Pfizer COVID-19 Vaccine 08/21/2019  9:23 AM 0.3 mL 06/27/2019 Intramuscular   Manufacturer: [place]   Lot: [id number]   [place]: [id number]

## 2019-08-25 ENCOUNTER — Ambulatory Visit: Payer: Self-pay

## 2019-08-26 DIAGNOSIS — M25551 Pain in right hip: Secondary | ICD-10-CM | POA: Diagnosis not present

## 2019-08-26 DIAGNOSIS — K2970 Gastritis, unspecified, without bleeding: Secondary | ICD-10-CM | POA: Diagnosis not present

## 2019-08-26 DIAGNOSIS — I10 Essential (primary) hypertension: Secondary | ICD-10-CM | POA: Diagnosis not present

## 2019-08-26 DIAGNOSIS — Z0000 Encounter for general adult medical examination without abnormal findings: Secondary | ICD-10-CM | POA: Diagnosis not present

## 2019-08-27 DIAGNOSIS — L821 Other seborrheic keratosis: Secondary | ICD-10-CM | POA: Diagnosis not present

## 2019-08-27 DIAGNOSIS — D225 Melanocytic nevi of trunk: Secondary | ICD-10-CM | POA: Diagnosis not present

## 2019-09-05 DIAGNOSIS — M1611 Unilateral primary osteoarthritis, right hip: Secondary | ICD-10-CM | POA: Diagnosis not present

## 2019-09-10 DIAGNOSIS — M25551 Pain in right hip: Secondary | ICD-10-CM | POA: Diagnosis not present

## 2019-09-10 DIAGNOSIS — G8929 Other chronic pain: Secondary | ICD-10-CM | POA: Diagnosis not present

## 2019-09-10 DIAGNOSIS — M25651 Stiffness of right hip, not elsewhere classified: Secondary | ICD-10-CM | POA: Diagnosis not present

## 2019-09-10 DIAGNOSIS — M6281 Muscle weakness (generalized): Secondary | ICD-10-CM | POA: Diagnosis not present

## 2019-09-15 ENCOUNTER — Ambulatory Visit: Payer: Medicare HMO | Attending: Internal Medicine

## 2019-09-15 DIAGNOSIS — Z23 Encounter for immunization: Secondary | ICD-10-CM | POA: Insufficient documentation

## 2019-09-15 NOTE — Progress Notes (Signed)
   Covid-19 Vaccination Clinic  Name:  Kaitlyn Ramirez    MRN: [id number] DOB: 10/28/1945  09/15/2019  Kaitlyn Ramirez was observed post Covid-19 immunization for 15 minutes without incidence. She was provided with Vaccine Information Sheet and instruction to access the V-Safe system.   Kaitlyn Ramirez was instructed to call 911 with any severe reactions post vaccine: Marland Kitchen Difficulty breathing  . Swelling of your face and throat  . A fast heartbeat  . A bad rash all over your body  . Dizziness and weakness    Immunizations Administered    Name Date Dose VIS Date Route   Pfizer COVID-19 Vaccine 09/15/2019  2:19 PM 0.3 mL 06/27/2019 Intramuscular   Manufacturer: [employer]   Lot: [id number]   [place]: [id number]

## 2019-09-23 DIAGNOSIS — G8929 Other chronic pain: Secondary | ICD-10-CM | POA: Diagnosis not present

## 2019-09-23 DIAGNOSIS — M25551 Pain in right hip: Secondary | ICD-10-CM | POA: Diagnosis not present

## 2019-09-25 DIAGNOSIS — G8929 Other chronic pain: Secondary | ICD-10-CM | POA: Diagnosis not present

## 2019-09-25 DIAGNOSIS — M25551 Pain in right hip: Secondary | ICD-10-CM | POA: Diagnosis not present

## 2019-09-30 DIAGNOSIS — M25551 Pain in right hip: Secondary | ICD-10-CM | POA: Diagnosis not present

## 2019-09-30 DIAGNOSIS — G8929 Other chronic pain: Secondary | ICD-10-CM | POA: Diagnosis not present

## 2019-10-02 DIAGNOSIS — M25551 Pain in right hip: Secondary | ICD-10-CM | POA: Diagnosis not present

## 2019-10-02 DIAGNOSIS — G8929 Other chronic pain: Secondary | ICD-10-CM | POA: Diagnosis not present

## 2019-10-06 DIAGNOSIS — M25551 Pain in right hip: Secondary | ICD-10-CM | POA: Diagnosis not present

## 2019-10-06 DIAGNOSIS — G8929 Other chronic pain: Secondary | ICD-10-CM | POA: Diagnosis not present

## 2019-10-09 DIAGNOSIS — M25551 Pain in right hip: Secondary | ICD-10-CM | POA: Diagnosis not present

## 2019-10-09 DIAGNOSIS — G8929 Other chronic pain: Secondary | ICD-10-CM | POA: Diagnosis not present

## 2019-10-13 DIAGNOSIS — G8929 Other chronic pain: Secondary | ICD-10-CM | POA: Diagnosis not present

## 2019-10-13 DIAGNOSIS — M25551 Pain in right hip: Secondary | ICD-10-CM | POA: Diagnosis not present

## 2019-10-14 DIAGNOSIS — H40033 Anatomical narrow angle, bilateral: Secondary | ICD-10-CM | POA: Diagnosis not present

## 2019-10-14 DIAGNOSIS — H2513 Age-related nuclear cataract, bilateral: Secondary | ICD-10-CM | POA: Diagnosis not present

## 2019-10-15 DIAGNOSIS — G8929 Other chronic pain: Secondary | ICD-10-CM | POA: Diagnosis not present

## 2019-10-15 DIAGNOSIS — M25551 Pain in right hip: Secondary | ICD-10-CM | POA: Diagnosis not present

## 2019-10-21 DIAGNOSIS — M25551 Pain in right hip: Secondary | ICD-10-CM | POA: Diagnosis not present

## 2019-10-21 DIAGNOSIS — G8929 Other chronic pain: Secondary | ICD-10-CM | POA: Diagnosis not present

## 2019-10-24 DIAGNOSIS — M25551 Pain in right hip: Secondary | ICD-10-CM | POA: Diagnosis not present

## 2019-10-24 DIAGNOSIS — G8929 Other chronic pain: Secondary | ICD-10-CM | POA: Diagnosis not present

## 2019-11-03 DIAGNOSIS — M1611 Unilateral primary osteoarthritis, right hip: Secondary | ICD-10-CM | POA: Diagnosis not present

## 2019-11-04 DIAGNOSIS — G8929 Other chronic pain: Secondary | ICD-10-CM | POA: Diagnosis not present

## 2019-11-04 DIAGNOSIS — M25551 Pain in right hip: Secondary | ICD-10-CM | POA: Diagnosis not present

## 2019-11-12 DIAGNOSIS — M25551 Pain in right hip: Secondary | ICD-10-CM | POA: Diagnosis not present

## 2019-11-12 DIAGNOSIS — G8929 Other chronic pain: Secondary | ICD-10-CM | POA: Diagnosis not present

## 2019-11-20 DIAGNOSIS — M25551 Pain in right hip: Secondary | ICD-10-CM | POA: Diagnosis not present

## 2019-11-20 DIAGNOSIS — G8929 Other chronic pain: Secondary | ICD-10-CM | POA: Diagnosis not present

## 2019-11-26 DIAGNOSIS — M25551 Pain in right hip: Secondary | ICD-10-CM | POA: Diagnosis not present

## 2019-11-26 DIAGNOSIS — G8929 Other chronic pain: Secondary | ICD-10-CM | POA: Diagnosis not present

## 2019-12-10 DIAGNOSIS — G8929 Other chronic pain: Secondary | ICD-10-CM | POA: Diagnosis not present

## 2019-12-10 DIAGNOSIS — M25551 Pain in right hip: Secondary | ICD-10-CM | POA: Diagnosis not present

## 2020-02-16 DIAGNOSIS — E782 Mixed hyperlipidemia: Secondary | ICD-10-CM | POA: Diagnosis not present

## 2020-02-23 DIAGNOSIS — E785 Hyperlipidemia, unspecified: Secondary | ICD-10-CM | POA: Diagnosis not present

## 2020-04-20 DIAGNOSIS — Z1231 Encounter for screening mammogram for malignant neoplasm of breast: Secondary | ICD-10-CM | POA: Diagnosis not present

## 2020-07-24 DIAGNOSIS — Z1152 Encounter for screening for COVID-19: Secondary | ICD-10-CM | POA: Diagnosis not present

## 2020-07-27 DIAGNOSIS — H2513 Age-related nuclear cataract, bilateral: Secondary | ICD-10-CM | POA: Diagnosis not present

## 2020-07-27 DIAGNOSIS — H40033 Anatomical narrow angle, bilateral: Secondary | ICD-10-CM | POA: Diagnosis not present

## 2020-08-25 DIAGNOSIS — E782 Mixed hyperlipidemia: Secondary | ICD-10-CM | POA: Diagnosis not present

## 2020-08-26 DIAGNOSIS — I10 Essential (primary) hypertension: Secondary | ICD-10-CM | POA: Diagnosis not present

## 2020-08-26 DIAGNOSIS — K219 Gastro-esophageal reflux disease without esophagitis: Secondary | ICD-10-CM | POA: Diagnosis not present

## 2020-08-26 DIAGNOSIS — I498 Other specified cardiac arrhythmias: Secondary | ICD-10-CM | POA: Diagnosis not present

## 2020-08-26 DIAGNOSIS — I4892 Unspecified atrial flutter: Secondary | ICD-10-CM | POA: Diagnosis not present

## 2020-08-26 DIAGNOSIS — I493 Ventricular premature depolarization: Secondary | ICD-10-CM | POA: Diagnosis not present

## 2020-08-26 DIAGNOSIS — E782 Mixed hyperlipidemia: Secondary | ICD-10-CM | POA: Diagnosis not present

## 2020-08-26 DIAGNOSIS — R002 Palpitations: Secondary | ICD-10-CM | POA: Diagnosis not present

## 2020-08-26 DIAGNOSIS — I471 Supraventricular tachycardia: Secondary | ICD-10-CM | POA: Diagnosis not present

## 2020-08-26 DIAGNOSIS — E079 Disorder of thyroid, unspecified: Secondary | ICD-10-CM | POA: Diagnosis not present

## 2020-09-01 DIAGNOSIS — R739 Hyperglycemia, unspecified: Secondary | ICD-10-CM | POA: Diagnosis not present

## 2020-09-01 DIAGNOSIS — Z0000 Encounter for general adult medical examination without abnormal findings: Secondary | ICD-10-CM | POA: Diagnosis not present

## 2020-09-01 DIAGNOSIS — Z23 Encounter for immunization: Secondary | ICD-10-CM | POA: Diagnosis not present

## 2020-09-23 DIAGNOSIS — K219 Gastro-esophageal reflux disease without esophagitis: Secondary | ICD-10-CM | POA: Diagnosis not present

## 2020-09-23 DIAGNOSIS — I498 Other specified cardiac arrhythmias: Secondary | ICD-10-CM | POA: Diagnosis not present

## 2020-09-23 DIAGNOSIS — I471 Supraventricular tachycardia: Secondary | ICD-10-CM | POA: Diagnosis not present

## 2020-09-23 DIAGNOSIS — I10 Essential (primary) hypertension: Secondary | ICD-10-CM | POA: Diagnosis not present

## 2020-09-23 DIAGNOSIS — E079 Disorder of thyroid, unspecified: Secondary | ICD-10-CM | POA: Diagnosis not present

## 2020-09-23 DIAGNOSIS — E782 Mixed hyperlipidemia: Secondary | ICD-10-CM | POA: Diagnosis not present

## 2020-10-20 DIAGNOSIS — K219 Gastro-esophageal reflux disease without esophagitis: Secondary | ICD-10-CM | POA: Diagnosis not present

## 2020-10-20 DIAGNOSIS — R140 Abdominal distension (gaseous): Secondary | ICD-10-CM | POA: Diagnosis not present

## 2020-10-20 DIAGNOSIS — R1013 Epigastric pain: Secondary | ICD-10-CM | POA: Diagnosis not present

## 2020-11-27 DIAGNOSIS — J0100 Acute maxillary sinusitis, unspecified: Secondary | ICD-10-CM | POA: Diagnosis not present

## 2020-12-16 DIAGNOSIS — I493 Ventricular premature depolarization: Secondary | ICD-10-CM | POA: Diagnosis not present

## 2020-12-16 DIAGNOSIS — I208 Other forms of angina pectoris: Secondary | ICD-10-CM | POA: Diagnosis not present

## 2020-12-16 DIAGNOSIS — K219 Gastro-esophageal reflux disease without esophagitis: Secondary | ICD-10-CM | POA: Diagnosis not present

## 2020-12-16 DIAGNOSIS — I471 Supraventricular tachycardia: Secondary | ICD-10-CM | POA: Diagnosis not present

## 2020-12-16 DIAGNOSIS — I10 Essential (primary) hypertension: Secondary | ICD-10-CM | POA: Diagnosis not present

## 2020-12-16 DIAGNOSIS — R002 Palpitations: Secondary | ICD-10-CM | POA: Diagnosis not present

## 2020-12-16 DIAGNOSIS — E079 Disorder of thyroid, unspecified: Secondary | ICD-10-CM | POA: Diagnosis not present

## 2020-12-16 DIAGNOSIS — E782 Mixed hyperlipidemia: Secondary | ICD-10-CM | POA: Diagnosis not present

## 2020-12-16 DIAGNOSIS — I498 Other specified cardiac arrhythmias: Secondary | ICD-10-CM | POA: Diagnosis not present

## 2020-12-28 ENCOUNTER — Ambulatory Visit: Admit: 2020-12-28 | Payer: Medicare HMO

## 2020-12-28 SURGERY — ESOPHAGOGASTRODUODENOSCOPY (EGD) WITH PROPOFOL
Anesthesia: General

## 2021-01-18 DIAGNOSIS — R002 Palpitations: Secondary | ICD-10-CM | POA: Diagnosis not present

## 2021-01-18 DIAGNOSIS — I208 Other forms of angina pectoris: Secondary | ICD-10-CM | POA: Diagnosis not present

## 2021-01-31 DIAGNOSIS — I10 Essential (primary) hypertension: Secondary | ICD-10-CM | POA: Diagnosis not present

## 2021-01-31 DIAGNOSIS — I498 Other specified cardiac arrhythmias: Secondary | ICD-10-CM | POA: Diagnosis not present

## 2021-01-31 DIAGNOSIS — I491 Atrial premature depolarization: Secondary | ICD-10-CM | POA: Diagnosis not present

## 2021-01-31 DIAGNOSIS — K219 Gastro-esophageal reflux disease without esophagitis: Secondary | ICD-10-CM | POA: Diagnosis not present

## 2021-01-31 DIAGNOSIS — I493 Ventricular premature depolarization: Secondary | ICD-10-CM | POA: Diagnosis not present

## 2021-01-31 DIAGNOSIS — E782 Mixed hyperlipidemia: Secondary | ICD-10-CM | POA: Diagnosis not present

## 2021-01-31 DIAGNOSIS — R002 Palpitations: Secondary | ICD-10-CM | POA: Diagnosis not present

## 2021-02-15 DIAGNOSIS — Z124 Encounter for screening for malignant neoplasm of cervix: Secondary | ICD-10-CM | POA: Diagnosis not present

## 2021-02-22 DIAGNOSIS — E782 Mixed hyperlipidemia: Secondary | ICD-10-CM | POA: Diagnosis not present

## 2021-02-22 DIAGNOSIS — E538 Deficiency of other specified B group vitamins: Secondary | ICD-10-CM | POA: Diagnosis not present

## 2021-03-01 DIAGNOSIS — G4710 Hypersomnia, unspecified: Secondary | ICD-10-CM | POA: Diagnosis not present

## 2021-04-26 DIAGNOSIS — Z1231 Encounter for screening mammogram for malignant neoplasm of breast: Secondary | ICD-10-CM | POA: Diagnosis not present

## 2021-04-28 DIAGNOSIS — H2513 Age-related nuclear cataract, bilateral: Secondary | ICD-10-CM | POA: Diagnosis not present

## 2021-04-28 DIAGNOSIS — H40033 Anatomical narrow angle, bilateral: Secondary | ICD-10-CM | POA: Diagnosis not present

## 2021-06-02 DIAGNOSIS — G4710 Hypersomnia, unspecified: Secondary | ICD-10-CM | POA: Diagnosis not present

## 2021-06-05 DIAGNOSIS — G4733 Obstructive sleep apnea (adult) (pediatric): Secondary | ICD-10-CM | POA: Diagnosis not present

## 2021-08-03 DIAGNOSIS — I371 Nonrheumatic pulmonary valve insufficiency: Secondary | ICD-10-CM | POA: Diagnosis not present

## 2021-08-03 DIAGNOSIS — K219 Gastro-esophageal reflux disease without esophagitis: Secondary | ICD-10-CM | POA: Diagnosis not present

## 2021-08-03 DIAGNOSIS — E039 Hypothyroidism, unspecified: Secondary | ICD-10-CM | POA: Diagnosis not present

## 2021-08-03 DIAGNOSIS — M170 Bilateral primary osteoarthritis of knee: Secondary | ICD-10-CM | POA: Diagnosis not present

## 2021-08-03 DIAGNOSIS — Z20822 Contact with and (suspected) exposure to covid-19: Secondary | ICD-10-CM | POA: Diagnosis not present

## 2021-08-03 DIAGNOSIS — I10 Essential (primary) hypertension: Secondary | ICD-10-CM | POA: Diagnosis not present

## 2021-08-03 DIAGNOSIS — H2511 Age-related nuclear cataract, right eye: Secondary | ICD-10-CM | POA: Diagnosis not present

## 2021-08-03 DIAGNOSIS — M160 Bilateral primary osteoarthritis of hip: Secondary | ICD-10-CM | POA: Diagnosis not present

## 2021-08-03 DIAGNOSIS — J45909 Unspecified asthma, uncomplicated: Secondary | ICD-10-CM | POA: Diagnosis not present

## 2021-08-16 DIAGNOSIS — H35351 Cystoid macular degeneration, right eye: Secondary | ICD-10-CM | POA: Diagnosis not present

## 2021-08-16 DIAGNOSIS — Z961 Presence of intraocular lens: Secondary | ICD-10-CM | POA: Diagnosis not present

## 2021-08-31 DIAGNOSIS — R140 Abdominal distension (gaseous): Secondary | ICD-10-CM | POA: Diagnosis not present

## 2021-08-31 DIAGNOSIS — K219 Gastro-esophageal reflux disease without esophagitis: Secondary | ICD-10-CM | POA: Diagnosis not present

## 2021-08-31 DIAGNOSIS — Z86010 Personal history of colonic polyps: Secondary | ICD-10-CM | POA: Diagnosis not present

## 2021-09-02 DIAGNOSIS — E538 Deficiency of other specified B group vitamins: Secondary | ICD-10-CM | POA: Diagnosis not present

## 2021-09-02 DIAGNOSIS — Z79899 Other long term (current) drug therapy: Secondary | ICD-10-CM | POA: Diagnosis not present

## 2021-09-02 DIAGNOSIS — E782 Mixed hyperlipidemia: Secondary | ICD-10-CM | POA: Diagnosis not present

## 2021-09-09 DIAGNOSIS — Z0000 Encounter for general adult medical examination without abnormal findings: Secondary | ICD-10-CM | POA: Diagnosis not present

## 2021-09-09 DIAGNOSIS — Z1389 Encounter for screening for other disorder: Secondary | ICD-10-CM | POA: Diagnosis not present

## 2021-09-21 DIAGNOSIS — I498 Other specified cardiac arrhythmias: Secondary | ICD-10-CM | POA: Diagnosis not present

## 2021-09-21 DIAGNOSIS — K219 Gastro-esophageal reflux disease without esophagitis: Secondary | ICD-10-CM | POA: Diagnosis not present

## 2021-09-21 DIAGNOSIS — I491 Atrial premature depolarization: Secondary | ICD-10-CM | POA: Diagnosis not present

## 2021-09-21 DIAGNOSIS — I471 Supraventricular tachycardia: Secondary | ICD-10-CM | POA: Diagnosis not present

## 2021-09-21 DIAGNOSIS — R002 Palpitations: Secondary | ICD-10-CM | POA: Diagnosis not present

## 2021-09-21 DIAGNOSIS — I493 Ventricular premature depolarization: Secondary | ICD-10-CM | POA: Diagnosis not present

## 2021-09-21 DIAGNOSIS — I10 Essential (primary) hypertension: Secondary | ICD-10-CM | POA: Diagnosis not present

## 2021-09-21 DIAGNOSIS — I208 Other forms of angina pectoris: Secondary | ICD-10-CM | POA: Diagnosis not present

## 2021-09-21 DIAGNOSIS — E782 Mixed hyperlipidemia: Secondary | ICD-10-CM | POA: Diagnosis not present

## 2021-11-29 DIAGNOSIS — Z961 Presence of intraocular lens: Secondary | ICD-10-CM | POA: Diagnosis not present

## 2021-11-29 DIAGNOSIS — H2512 Age-related nuclear cataract, left eye: Secondary | ICD-10-CM | POA: Diagnosis not present

## 2021-11-29 DIAGNOSIS — H40033 Anatomical narrow angle, bilateral: Secondary | ICD-10-CM | POA: Diagnosis not present

## 2021-12-08 ENCOUNTER — Encounter
Admission: RE | Disposition: A | Discharge status: Home / Self Care | Payer: Self-pay | Source: Home / Self Care | Attending: Gastroenterology

## 2021-12-08 ENCOUNTER — Ambulatory Visit: Payer: Medicare HMO | Admitting: Certified Registered"

## 2021-12-08 ENCOUNTER — Encounter: Payer: Self-pay | Admitting: Gastroenterology

## 2021-12-08 ENCOUNTER — Ambulatory Visit
Admission: RE | Admit: 2021-12-08 | Discharge: 2021-12-08 | Disposition: A | Discharge status: Home / Self Care | Payer: Medicare HMO | Attending: Gastroenterology | Admitting: Gastroenterology

## 2021-12-08 DIAGNOSIS — D128 Benign neoplasm of rectum: Secondary | ICD-10-CM | POA: Diagnosis not present

## 2021-12-08 DIAGNOSIS — R1310 Dysphagia, unspecified: Secondary | ICD-10-CM | POA: Insufficient documentation

## 2021-12-08 DIAGNOSIS — K449 Diaphragmatic hernia without obstruction or gangrene: Secondary | ICD-10-CM | POA: Insufficient documentation

## 2021-12-08 DIAGNOSIS — D649 Anemia, unspecified: Secondary | ICD-10-CM | POA: Diagnosis not present

## 2021-12-08 DIAGNOSIS — K640 First degree hemorrhoids: Secondary | ICD-10-CM | POA: Insufficient documentation

## 2021-12-08 DIAGNOSIS — M1990 Unspecified osteoarthritis, unspecified site: Secondary | ICD-10-CM | POA: Diagnosis not present

## 2021-12-08 DIAGNOSIS — R140 Abdominal distension (gaseous): Secondary | ICD-10-CM | POA: Insufficient documentation

## 2021-12-08 DIAGNOSIS — Z1211 Encounter for screening for malignant neoplasm of colon: Secondary | ICD-10-CM | POA: Diagnosis not present

## 2021-12-08 DIAGNOSIS — D122 Benign neoplasm of ascending colon: Secondary | ICD-10-CM | POA: Insufficient documentation

## 2021-12-08 DIAGNOSIS — Z79899 Other long term (current) drug therapy: Secondary | ICD-10-CM | POA: Insufficient documentation

## 2021-12-08 DIAGNOSIS — K621 Rectal polyp: Secondary | ICD-10-CM | POA: Insufficient documentation

## 2021-12-08 DIAGNOSIS — K219 Gastro-esophageal reflux disease without esophagitis: Secondary | ICD-10-CM | POA: Diagnosis not present

## 2021-12-08 DIAGNOSIS — K6289 Other specified diseases of anus and rectum: Secondary | ICD-10-CM | POA: Diagnosis not present

## 2021-12-08 DIAGNOSIS — Z86010 Personal history of colonic polyps: Secondary | ICD-10-CM | POA: Diagnosis not present

## 2021-12-08 DIAGNOSIS — I890 Lymphedema, not elsewhere classified: Secondary | ICD-10-CM | POA: Diagnosis not present

## 2021-12-08 DIAGNOSIS — K2289 Other specified disease of esophagus: Secondary | ICD-10-CM | POA: Diagnosis not present

## 2021-12-08 DIAGNOSIS — D759 Disease of blood and blood-forming organs, unspecified: Secondary | ICD-10-CM | POA: Diagnosis not present

## 2021-12-08 DIAGNOSIS — K635 Polyp of colon: Secondary | ICD-10-CM | POA: Diagnosis not present

## 2021-12-08 HISTORY — PX: COLONOSCOPY WITH PROPOFOL: SHX5780

## 2021-12-08 HISTORY — PX: ESOPHAGOGASTRODUODENOSCOPY (EGD) WITH PROPOFOL: SHX5813

## 2021-12-08 SURGERY — COLONOSCOPY WITH PROPOFOL
Anesthesia: General

## 2021-12-08 MED ORDER — LIDOCAINE HCL (CARDIAC) PF 100 MG/5ML IV SOSY
PREFILLED_SYRINGE | INTRAVENOUS | Status: DC | PRN
Start: 2021-12-08 — End: 2021-12-08
  Administered 2021-12-08: 50 mg via INTRAVENOUS

## 2021-12-08 MED ORDER — PROPOFOL 10 MG/ML IV BOLUS
INTRAVENOUS | Status: DC | PRN
Start: 2021-12-08 — End: 2021-12-08
  Administered 2021-12-08: 100 mg via INTRAVENOUS

## 2021-12-08 MED ORDER — PROPOFOL 500 MG/50ML IV EMUL
INTRAVENOUS | Status: AC
  Filled 2021-12-08: qty 50

## 2021-12-08 MED ORDER — SODIUM CHLORIDE 0.9 % IV SOLN: INTRAVENOUS | Status: DC

## 2021-12-08 MED ORDER — GLYCOPYRROLATE 0.2 MG/ML IJ SOLN
INTRAMUSCULAR | Status: DC | PRN
  Administered 2021-12-08: .2 mg via INTRAVENOUS

## 2021-12-08 MED ORDER — PROPOFOL 500 MG/50ML IV EMUL
INTRAVENOUS | Status: DC | PRN
  Administered 2021-12-08: 120 ug/kg/min via INTRAVENOUS

## 2021-12-08 NOTE — H&P (Signed)
Pre-Procedure H&P   Patient ID: Kaitlyn Ramirez is a 76 y.o. female.  Gastroenterology Provider: Annamaria Helling, DO  Referring Provider: Laurine Blazer, PA PCP: Rusty Aus, MD  Date: 12/08/2021  HPI Kaitlyn Ramirez is a 76 y.o. female who presents today for Esophagogastroduodenoscopy and Colonoscopy for gerd, dyspepsia, surveillance: phx polyps. Pt on bid ppi, still with sx but overall improved. Post prandial blolating. Notes pill dysphagia. No issues with solids or liquids. No abdominal pain, n/v.  BM daily w/o melena,hematochezia, diarrhea, constipation. CSY 10/2016- cecal tubular adenoma, ih, otherwise normal 2007 csy- normal  Egd 08/2012 and 03/2009- h/o schatskis ring- dil with 51 fr savary Hgb 12.1 mcv 87 plt 241 cr 0.8  S/p appy and hysto Sister- h/o colon polyps    Past Medical History:  Diagnosis Date   Anemia    Arthritis    Dyspnea    Dysrhythmia 2015   heart palpatations, irregular rhythm   GERD (gastroesophageal reflux disease)    Hypercholesteremia    Hyperlipidemia    Irritable bowel    Malaise and fatigue    Obesity    Phlebitis    Sinusitis     Past Surgical History:  Procedure Laterality Date   ABDOMINAL HYSTERECTOMY     BREAST SURGERY     breast biopsies   COLONOSCOPY     COLONOSCOPY WITH PROPOFOL N/A 10/18/2016   Procedure: COLONOSCOPY WITH PROPOFOL;  Surgeon: Manya Silvas, MD;  Location: [facility] ENDOSCOPY;  Service: Endoscopy;  Laterality: N/A;   ESOPHAGOGASTRODUODENOSCOPY     EYE SURGERY Bilateral 10/05/2012   iridectomy peripheral for glaucoma   LIPOMA EXCISION  09/19/2018   Back    Family History Sister- polyps No h/o GI disease or malignancy  Review of Systems  Constitutional:  Negative for activity change, appetite change, chills, diaphoresis, fatigue, fever and unexpected weight change.  HENT:  Positive for trouble swallowing. Negative for voice change.   Respiratory:  Negative for shortness of breath and  wheezing.   Cardiovascular:  Negative for chest pain, palpitations and leg swelling.  Gastrointestinal:  Negative for abdominal distention, abdominal pain, anal bleeding, blood in stool, constipation, diarrhea, nausea, rectal pain and vomiting.  Musculoskeletal:  Negative for arthralgias and myalgias.  Skin:  Negative for color change and pallor.  Neurological:  Negative for dizziness, syncope and weakness.  Psychiatric/Behavioral:  Negative for confusion.   All other systems reviewed and are negative.   Medications No current facility-administered medications on file prior to encounter.   Current Outpatient Medications on File Prior to Encounter  Medication Sig Dispense Refill   dicyclomine (BENTYL) 20 MG tablet Take 20 mg by mouth every 6 (six) hours.     diltiazem (CARDIZEM) 30 MG tablet Take 30 mg by mouth 4 (four) times daily.     Magnesium Oxide 250 MG TABS Take by mouth.     metoprolol succinate (TOPROL-XL) 25 MG 24 hr tablet Take 25 mg by mouth daily.     mirabegron ER (MYRBETRIQ) 25 MG TB24 tablet Take 25 mg by mouth daily.     pantoprazole (PROTONIX) 40 MG tablet Take 40 mg by mouth daily.     amLODipine (NORVASC) 5 MG tablet      Artificial Tear Solution (TEARS NATURALE FORTE OP) Apply 1 drop to eye every 4 (four) hours as needed (for dry eyes).     Calcium Carbonate-Vitamin D (CALCIUM HIGH POTENCY/VITAMIN D) 600-200 MG-UNIT TABS Take by mouth.     calcium-vitamin  D 250-100 MG-UNIT tablet Take 1 tablet by mouth 2 (two) times daily.     cefUROXime (CEFTIN) 250 MG tablet      ibuprofen (ADVIL,MOTRIN) 200 MG tablet Take 400 mg by mouth every evening.     LACTOBACILLUS RHAMNOSUS, GG, PO Take by mouth daily.     latanoprost (XALATAN) 0.005 % ophthalmic solution Place 1 drop into both eyes at bedtime.     loratadine (CLARITIN) 10 MG tablet Take 10 mg by mouth daily.     OMEGA-3 FATTY ACIDS PO Take 1 capsule by mouth daily. 340-1,'000mg'$      omeprazole (PRILOSEC) 40 MG capsule Take 40  mg by mouth daily.     pravastatin (PRAVACHOL) 10 MG tablet Take 10 mg by mouth every evening.      Pertinent medications related to GI and procedure were reviewed by me with the patient prior to the procedure   Current Facility-Administered Medications:    0.9 %  sodium chloride infusion, , Intravenous, Continuous, Annamaria Helling, DO, Last Rate: 20 mL/hr at 12/08/21 0824, New Bag at 12/08/21 4166      Allergies  Allergen Reactions   Amoxicillin Nausea And Vomiting   Sulfa Antibiotics    Allergies were reviewed by me prior to the procedure  Objective   Body mass index is 25.1 kg/m. Vitals:   12/08/21 0808  BP: (!) 151/69  Pulse: 70  Resp: 16  Temp: 97.6 F (36.4 C)  TempSrc: Temporal  SpO2: 100%  Weight: 77.1 kg  Height: '5\' 9"'$  (1.753 m)     Physical Exam Vitals and nursing note reviewed.  Constitutional:      General: She is not in acute distress.    Appearance: Normal appearance. She is not ill-appearing, toxic-appearing or diaphoretic.  HENT:     Head: Normocephalic and atraumatic.     Nose: Nose normal.     Mouth/Throat:     Mouth: Mucous membranes are moist.     Pharynx: Oropharynx is clear.  Eyes:     General: No scleral icterus.    Extraocular Movements: Extraocular movements intact.  Cardiovascular:     Rate and Rhythm: Normal rate and regular rhythm.     Heart sounds: Normal heart sounds. No murmur heard.   No friction rub. No gallop.  Pulmonary:     Effort: Pulmonary effort is normal. No respiratory distress.     Breath sounds: Normal breath sounds. No wheezing, rhonchi or rales.  Abdominal:     General: Bowel sounds are normal. There is no distension.     Palpations: Abdomen is soft.     Tenderness: There is no abdominal tenderness. There is no guarding or rebound.  Musculoskeletal:     Cervical back: Neck supple.     Right lower leg: No edema.     Left lower leg: No edema.  Skin:    General: Skin is warm and dry.     Coloration:  Skin is not jaundiced or pale.  Neurological:     General: No focal deficit present.     Mental Status: She is alert and oriented to person, place, and time. Mental status is at baseline.  Psychiatric:        Mood and Affect: Mood normal.        Behavior: Behavior normal.        Thought Content: Thought content normal.        Judgment: Judgment normal.     Assessment:  Kaitlyn Ramirez is  a 76 y.o. female  who presents today for Esophagogastroduodenoscopy and Colonoscopy for gerd, dyspepsia, surveillance: phx polyps.  Plan:  Esophagogastroduodenoscopy and Colonoscopy with possible intervention today  Esophagogastroduodenoscopy and Colonoscopy with possible biopsy, control of bleeding, polypectomy, and interventions as necessary has been discussed with the patient/patient representative. Informed consent was obtained from the patient/patient representative after explaining the indication, nature, and risks of the procedure including but not limited to death, bleeding, perforation, missed neoplasm/lesions, cardiorespiratory compromise, and reaction to medications. Opportunity for questions was given and appropriate answers were provided. Patient/patient representative has verbalized understanding is amenable to undergoing the procedure.   Annamaria Helling, DO  [facility] Gastroenterology  Portions of the record may have been created with voice recognition software. Occasional wrong-word or 'sound-a-like' substitutions may have occurred due to the inherent limitations of voice recognition software.  Read the chart carefully and recognize, using context, where substitutions may have occurred.

## 2021-12-08 NOTE — Anesthesia Postprocedure Evaluation (Signed)
Anesthesia Post Note  Patient: Kaitlyn Ramirez  Procedure(s) Performed: COLONOSCOPY WITH PROPOFOL ESOPHAGOGASTRODUODENOSCOPY (EGD) WITH PROPOFOL  Patient location during evaluation: PACU Anesthesia Type: General Level of consciousness: awake and oriented Pain management: pain level controlled Vital Signs Assessment: post-procedure vital signs reviewed and stable Respiratory status: spontaneous breathing and respiratory function stable Cardiovascular status: stable Anesthetic complications: no   No notable events documented.   Last Vitals:  Vitals:   12/08/21 0946 12/08/21 0956  BP: (!) 137/52 124/60  Pulse: 89   Resp: 17   Temp:    SpO2: 100%     Last Pain:  Vitals:   12/08/21 0946  TempSrc:   PainSc: 0-No pain                 VAN STAVEREN,Ewan Grau

## 2021-12-08 NOTE — Interval H&P Note (Signed)
History and Physical Interval Note: Preprocedure H&P from 12/08/21  was reviewed and there was no interval change after seeing and examining the patient.  Written consent was obtained from the patient after discussion of risks, benefits, and alternatives. Patient has consented to proceed with Esophagogastroduodenoscopy and Colonoscopy with possible intervention   12/08/2021 8:32 AM  Kaitlyn Ramirez  has presented today for surgery, with the diagnosis of K21.9  - Gastroesophageal reflux disease, unspecified whether esophagitis present R14.0  - Abdominal bloating Z86.010  - Personal history of colonic polyps.  The various methods of treatment have been discussed with the patient and family. After consideration of risks, benefits and other options for treatment, the patient has consented to  Procedure(s): COLONOSCOPY WITH PROPOFOL (N/A) ESOPHAGOGASTRODUODENOSCOPY (EGD) WITH PROPOFOL (N/A) as a surgical intervention.  The patient's history has been reviewed, patient examined, no change in status, stable for surgery.  I have reviewed the patient's chart and labs.  Questions were answered to the patient's satisfaction.     Annamaria Helling

## 2021-12-08 NOTE — Op Note (Signed)
[facility] Gastroenterology Patient Name: Kaitlyn Ramirez Procedure Date: 12/08/2021 8:30 AM MRN: [id number] Account #: [id number] Date of Birth: May 28, 1946 Admit Type: Outpatient Age: 76 Room: [facility] ENDO ROOM 1 Gender: Female Note Status: Finalized Instrument Name: Upper Endoscope [id number] Procedure:             Upper GI endoscopy Indications:           Dysphagia Providers:             Annamaria Helling DO, DO Referring MD:          Rusty Aus, MD (Referring MD) Medicines:             Monitored Anesthesia Care Complications:         No immediate complications. Estimated blood loss:                         Minimal. Procedure:             Pre-Anesthesia Assessment:                        - Prior to the procedure, a History and Physical was                         performed, and patient medications and allergies were                         reviewed. The patient is competent. The risks and                         benefits of the procedure and the sedation options and                         risks were discussed with the patient. All questions                         were answered and informed consent was obtained.                         Patient identification and proposed procedure were                         verified by the physician, the nurse, the anesthetist                         and the technician in the endoscopy suite. Mental                         Status Examination: alert and oriented. Airway                         Examination: normal oropharyngeal airway and neck                         mobility. Respiratory Examination: clear to                         auscultation. CV Examination: RRR, no murmurs, no S3  or S4. Prophylactic Antibiotics: The patient does not                         require prophylactic antibiotics. Prior                         Anticoagulants: The patient has taken no previous                          anticoagulant or antiplatelet agents. ASA Grade                         Assessment: III - A patient with severe systemic                         disease. After reviewing the risks and benefits, the                         patient was deemed in satisfactory condition to                         undergo the procedure. The anesthesia plan was to use                         monitored anesthesia care (MAC). Immediately prior to                         administration of medications, the patient was                         re-assessed for adequacy to receive sedatives. The                         heart rate, respiratory rate, oxygen saturations,                         blood pressure, adequacy of pulmonary ventilation, and                         response to care were monitored throughout the                         procedure. The physical status of the patient was                         re-assessed after the procedure.                        After obtaining informed consent, the endoscope was                         passed under direct vision. Throughout the procedure,                         the patient's blood pressure, pulse, and oxygen                         saturations were monitored continuously. The  Endosonoscope was introduced through the mouth, and                         advanced to the second part of duodenum. The upper GI                         endoscopy was accomplished without difficulty. The                         patient tolerated the procedure well. Findings:      Lymphangiectasia was present in the second portion of the duodenum.       Biopsies were taken with a cold forceps for histology. Estimated blood       loss was minimal.      The duodenal bulb, first portion of the duodenum and second portion of       the duodenum were normal. Estimated blood loss: none.      A small hiatal hernia was present. Estimated blood loss: none.      The entire  examined stomach was normal. Estimated blood loss: none.      The Z-line was regular. Estimated blood loss: none.      Esophagogastric landmarks were identified: the gastroesophageal junction       was found at 40 cm from the incisors.      Normal mucosa was found in the entire esophagus. The scope was       withdrawn. Dilation was performed with a Maloney dilator with no       resistance at 52 Fr. The dilation site was examined following endoscope       reinsertion and showed mild mucosal disruption. Estimated blood loss was       minimal.      The exam was otherwise without abnormality. Impression:            - Duodenal mucosal lymphangiectasia.                        - Normal duodenal bulb, first portion of the duodenum                         and second portion of the duodenum.                        - Small hiatal hernia.                        - Normal stomach.                        - Z-line regular.                        - Esophagogastric landmarks identified.                        - Normal mucosa was found in the entire esophagus.                         Dilated.                        - The examination was otherwise normal. Recommendation:        -  Discharge patient to home.                        - Continue present medications.                        - Await pathology results.                        - Return to GI clinic as previously scheduled.                        - Soft diet today.                        - The findings and recommendations were discussed with                         the patient. Procedure Code(s):     --- Professional ---                        [phone], Esophagogastroduodenoscopy, flexible,                         transoral; with biopsy, single or multiple                        43450, Dilation of esophagus, by unguided sound or                         bougie, single or multiple passes Diagnosis Code(s):     --- Professional ---                         I89.0, Lymphedema, not elsewhere classified                        K44.9, Diaphragmatic hernia without obstruction or                         gangrene                        R13.10, Dysphagia, unspecified CPT copyright 2019 American Medical Association. All rights reserved. The codes documented in this report are preliminary and upon coder review may  be revised to meet current compliance requirements. Attending Participation:      I personally performed the entire procedure. Volney American, DO Annamaria Helling DO, DO 12/08/2021 8:57:04 AM This report has been signed electronically. Number of Addenda: 0 Note Initiated On: 12/08/2021 8:30 AM Estimated Blood Loss:  Estimated blood loss was minimal.      [facility]

## 2021-12-08 NOTE — Anesthesia Preprocedure Evaluation (Signed)
Anesthesia Evaluation  Patient identified by MRN, date of birth, ID band Patient awake    Reviewed: Allergy & Precautions, NPO status , Patient's Chart, lab work & pertinent test results  Airway Mallampati: III  TM Distance: >3 FB Neck ROM: full    Dental  (+) Upper Dentures, Partial Lower, Dental Advisory Given   Pulmonary neg pulmonary ROS, shortness of breath and with exertion, former smoker,    Pulmonary exam normal breath sounds clear to auscultation       Cardiovascular Exercise Tolerance: Good hypertension, Pt. on medications negative cardio ROS Normal cardiovascular exam+ dysrhythmias  Rhythm:Regular Rate:Normal     Neuro/Psych negative neurological ROS  negative psych ROS   GI/Hepatic negative GI ROS, Neg liver ROS, GERD  ,  Endo/Other  negative endocrine ROS  Renal/GU negative Renal ROS  negative genitourinary   Musculoskeletal  (+) Arthritis ,   Abdominal Normal abdominal exam  (+)   Peds negative pediatric ROS (+)  Hematology negative hematology ROS (+) Blood dyscrasia, anemia ,   Anesthesia Other Findings Past Medical History: No date: Anemia No date: Arthritis No date: Dyspnea 2015: Dysrhythmia     Comment:  heart palpatations, irregular rhythm No date: GERD (gastroesophageal reflux disease) No date: Hypercholesteremia No date: Hyperlipidemia No date: Irritable bowel No date: Malaise and fatigue No date: Obesity No date: Phlebitis No date: Sinusitis  Past Surgical History: No date: ABDOMINAL HYSTERECTOMY No date: BREAST SURGERY     Comment:  breast biopsies No date: COLONOSCOPY 10/18/2016: COLONOSCOPY WITH PROPOFOL; N/A     Comment:  Procedure: COLONOSCOPY WITH PROPOFOL;  Surgeon: Manya Silvas, MD;  Location: [facility] ENDOSCOPY;  Service:               Endoscopy;  Laterality: N/A; No date: ESOPHAGOGASTRODUODENOSCOPY 10/05/2012: EYE SURGERY; Bilateral     Comment:   iridectomy peripheral for glaucoma 09/19/2018: LIPOMA EXCISION     Comment:  Back     Reproductive/Obstetrics negative OB ROS                             Anesthesia Physical Anesthesia Plan  ASA: 3  Anesthesia Plan: General   Post-op Pain Management:    Induction: Intravenous  PONV Risk Score and Plan: Propofol infusion and TIVA  Airway Management Planned: Natural Airway  Additional Equipment:   Intra-op Plan:   Post-operative Plan:   Informed Consent: I have reviewed the patients History and Physical, chart, labs and discussed the procedure including the risks, benefits and alternatives for the proposed anesthesia with the patient or authorized representative who has indicated his/her understanding and acceptance.     Dental Advisory Given  Plan Discussed with: Anesthesiologist, CRNA and Surgeon  Anesthesia Plan Comments:         Anesthesia Quick Evaluation

## 2021-12-08 NOTE — Transfer of Care (Signed)
Immediate Anesthesia Transfer of Care Note  Patient: Kaitlyn Ramirez  Procedure(s) Performed: COLONOSCOPY WITH PROPOFOL ESOPHAGOGASTRODUODENOSCOPY (EGD) WITH PROPOFOL  Patient Location: PACU and Endoscopy Unit  Anesthesia Type:General  Level of Consciousness: drowsy  Airway & Oxygen Therapy: Patient Spontanous Breathing and Patient connected to nasal cannula oxygen  Post-op Assessment: Report given to RN  Post vital signs: stable  Last Vitals:  Vitals Value Taken Time  BP    Temp    Pulse    Resp    SpO2      Last Pain:  Vitals:   12/08/21 0808  TempSrc: Temporal  PainSc: 0-No pain         Complications: No notable events documented.

## 2021-12-08 NOTE — Op Note (Signed)
[facility] Gastroenterology Patient Name: Kaitlyn Ramirez Procedure Date: 12/08/2021 8:29 AM MRN: [id number] Account #: [id number] Date of Birth: 09-29-1945 Admit Type: Inpatient Age: 76 Room: [facility] ENDO ROOM 1 Gender: Female Note Status: Finalized Instrument Name: Colonscope [id number] Procedure:             Colonoscopy Indications:           High risk colon cancer surveillance: Personal history                         of colonic polyps Providers:             Rueben Bash, DO Referring MD:          Rusty Aus, MD (Referring MD) Medicines:             Monitored Anesthesia Care Complications:         No immediate complications. Estimated blood loss:                         Minimal. Procedure:             Pre-Anesthesia Assessment:                        - Prior to the procedure, a History and Physical was                         performed, and patient medications and allergies were                         reviewed. The patient is competent. The risks and                         benefits of the procedure and the sedation options and                         risks were discussed with the patient. All questions                         were answered and informed consent was obtained.                         Patient identification and proposed procedure were                         verified by the physician, the nurse, the anesthetist                         and the technician in the endoscopy suite. Mental                         Status Examination: alert and oriented. Airway                         Examination: normal oropharyngeal airway and neck                         mobility. Respiratory Examination: clear to  auscultation. CV Examination: RRR, no murmurs, no S3                         or S4. Prophylactic Antibiotics: The patient does not                         require prophylactic antibiotics. Prior                          Anticoagulants: The patient has taken no previous                         anticoagulant or antiplatelet agents. ASA Grade                         Assessment: III - A patient with severe systemic                         disease. After reviewing the risks and benefits, the                         patient was deemed in satisfactory condition to                         undergo the procedure. The anesthesia plan was to use                         monitored anesthesia care (MAC). Immediately prior to                         administration of medications, the patient was                         re-assessed for adequacy to receive sedatives. The                         heart rate, respiratory rate, oxygen saturations,                         blood pressure, adequacy of pulmonary ventilation, and                         response to care were monitored throughout the                         procedure. The physical status of the patient was                         re-assessed after the procedure.                        After obtaining informed consent, the colonoscope was                         passed under direct vision. Throughout the procedure,                         the patient's blood pressure, pulse, and oxygen  saturations were monitored continuously. The                         Colonoscope was introduced through the anus and                         advanced to the the terminal ileum, with                         identification of the appendiceal orifice and IC                         valve. The colonoscopy was performed without                         difficulty. The patient tolerated the procedure well.                         The quality of the bowel preparation was evaluated                         using the BBPS [facility] Bowel Preparation Scale) with                         scores of: Right Colon = 3, Transverse Colon = 3 and                         Left Colon = 3  (entire mucosa seen well with no                         residual staining, small fragments of stool or opaque                         liquid). The total BBPS score equals 9. The terminal                         ileum, ileocecal valve, appendiceal orifice, and                         rectum were photographed. Findings:      The perianal and digital rectal examinations were normal. Pertinent       negatives include normal sphincter tone.      The terminal ileum appeared normal. Estimated blood loss: none.      Non-bleeding internal hemorrhoids were found during retroflexion. The       hemorrhoids were Grade I (internal hemorrhoids that do not prolapse).       Estimated blood loss: none.      Anal papilla(e) were hypertrophied. Estimated blood loss: none.      Two sessile polyps were found in the ascending colon. The polyps were 3       to 4 mm in size. Estimated blood loss was minimal.      Three sessile polyps were found in the rectum, ascending colon and       cecum. The polyps were 1 to 2 mm in size. These polyps were removed with       a jumbo cold forceps. Resection and retrieval were complete. Estimated  blood loss was minimal.      The exam was otherwise without abnormality on direct and retroflexion       views. Impression:            - The examined portion of the ileum was normal.                        - Non-bleeding internal hemorrhoids.                        - Anal papilla(e) were hypertrophied.                        - Two 3 to 4 mm polyps in the ascending colon.                        - Three 1 to 2 mm polyps in the rectum, in the                         ascending colon and in the cecum, removed with a jumbo                         cold forceps. Resected and retrieved.                        - The examination was otherwise normal on direct and                         retroflexion views. Recommendation:        - Discharge patient to home.                        -  Resume previous diet.                        - Continue present medications.                        - Await pathology results.                        - Repeat colonoscopy for surveillance based on                         pathology results.                        - Return to GI office as previously scheduled.                        - No aspirin, ibuprofen, naproxen, or other                         non-steroidal anti-inflammatory drugs for 5 days after                         polyp removal.                        - The findings and recommendations were discussed with  the patient. Procedure Code(s):     --- Professional ---                        [phone], Colonoscopy, flexible; with biopsy, single or                         multiple Diagnosis Code(s):     --- Professional ---                        Z86.010, Personal history of colonic polyps                        K64.0, First degree hemorrhoids                        K62.89, Other specified diseases of anus and rectum                        K62.1, Rectal polyp                        K63.5, Polyp of colon CPT copyright 2019 American Medical Association. All rights reserved. The codes documented in this report are preliminary and upon coder review may  be revised to meet current compliance requirements. Attending Participation:      I personally performed the entire procedure. Volney American, DO Annamaria Helling DO, DO 12/08/2021 9:32:07 AM This report has been signed electronically. Number of Addenda: 0 Note Initiated On: 12/08/2021 8:29 AM Scope Withdrawal Time: 0 hours 19 minutes 43 seconds  Total Procedure Duration: 0 hours 22 minutes 44 seconds  Estimated Blood Loss:  Estimated blood loss was minimal.      [facility]

## 2021-12-09 ENCOUNTER — Encounter: Payer: Self-pay | Admitting: Gastroenterology

## 2021-12-09 LAB — SURGICAL PATHOLOGY

## 2021-12-27 DIAGNOSIS — Z961 Presence of intraocular lens: Secondary | ICD-10-CM | POA: Diagnosis not present

## 2021-12-27 DIAGNOSIS — H2512 Age-related nuclear cataract, left eye: Secondary | ICD-10-CM | POA: Diagnosis not present

## 2021-12-27 DIAGNOSIS — H40033 Anatomical narrow angle, bilateral: Secondary | ICD-10-CM | POA: Diagnosis not present

## 2022-02-27 DIAGNOSIS — E782 Mixed hyperlipidemia: Secondary | ICD-10-CM | POA: Diagnosis not present

## 2022-03-03 DIAGNOSIS — H6121 Impacted cerumen, right ear: Secondary | ICD-10-CM | POA: Diagnosis not present

## 2022-03-03 DIAGNOSIS — E785 Hyperlipidemia, unspecified: Secondary | ICD-10-CM | POA: Diagnosis not present

## 2022-03-09 DIAGNOSIS — Z124 Encounter for screening for malignant neoplasm of cervix: Secondary | ICD-10-CM | POA: Diagnosis not present

## 2022-03-09 DIAGNOSIS — N3941 Urge incontinence: Secondary | ICD-10-CM | POA: Diagnosis not present

## 2022-03-28 DIAGNOSIS — H40033 Anatomical narrow angle, bilateral: Secondary | ICD-10-CM | POA: Diagnosis not present

## 2022-03-28 DIAGNOSIS — H2512 Age-related nuclear cataract, left eye: Secondary | ICD-10-CM | POA: Diagnosis not present

## 2022-03-28 DIAGNOSIS — Z961 Presence of intraocular lens: Secondary | ICD-10-CM | POA: Diagnosis not present

## 2022-03-29 DIAGNOSIS — D2272 Melanocytic nevi of left lower limb, including hip: Secondary | ICD-10-CM | POA: Diagnosis not present

## 2022-03-29 DIAGNOSIS — D2261 Melanocytic nevi of right upper limb, including shoulder: Secondary | ICD-10-CM | POA: Diagnosis not present

## 2022-03-29 DIAGNOSIS — L720 Epidermal cyst: Secondary | ICD-10-CM | POA: Diagnosis not present

## 2022-03-29 DIAGNOSIS — D2262 Melanocytic nevi of left upper limb, including shoulder: Secondary | ICD-10-CM | POA: Diagnosis not present

## 2022-03-29 DIAGNOSIS — D2271 Melanocytic nevi of right lower limb, including hip: Secondary | ICD-10-CM | POA: Diagnosis not present

## 2022-03-29 DIAGNOSIS — L578 Other skin changes due to chronic exposure to nonionizing radiation: Secondary | ICD-10-CM | POA: Diagnosis not present

## 2022-03-29 DIAGNOSIS — D225 Melanocytic nevi of trunk: Secondary | ICD-10-CM | POA: Diagnosis not present

## 2022-03-29 DIAGNOSIS — L821 Other seborrheic keratosis: Secondary | ICD-10-CM | POA: Diagnosis not present

## 2022-03-29 DIAGNOSIS — L2089 Other atopic dermatitis: Secondary | ICD-10-CM | POA: Diagnosis not present

## 2022-04-28 DIAGNOSIS — Z03818 Encounter for observation for suspected exposure to other biological agents ruled out: Secondary | ICD-10-CM | POA: Diagnosis not present

## 2022-05-02 DIAGNOSIS — Z1231 Encounter for screening mammogram for malignant neoplasm of breast: Secondary | ICD-10-CM | POA: Diagnosis not present

## 2022-05-05 DIAGNOSIS — Z23 Encounter for immunization: Secondary | ICD-10-CM | POA: Diagnosis not present

## 2022-06-28 DIAGNOSIS — K219 Gastro-esophageal reflux disease without esophagitis: Secondary | ICD-10-CM | POA: Diagnosis not present

## 2022-06-28 DIAGNOSIS — R140 Abdominal distension (gaseous): Secondary | ICD-10-CM | POA: Diagnosis not present

## 2022-08-01 DIAGNOSIS — H40033 Anatomical narrow angle, bilateral: Secondary | ICD-10-CM | POA: Diagnosis not present

## 2022-08-01 DIAGNOSIS — Z961 Presence of intraocular lens: Secondary | ICD-10-CM | POA: Diagnosis not present

## 2022-09-07 DIAGNOSIS — E782 Mixed hyperlipidemia: Secondary | ICD-10-CM | POA: Diagnosis not present

## 2022-09-07 DIAGNOSIS — R739 Hyperglycemia, unspecified: Secondary | ICD-10-CM | POA: Diagnosis not present

## 2022-09-14 DIAGNOSIS — I493 Ventricular premature depolarization: Secondary | ICD-10-CM | POA: Diagnosis not present

## 2022-09-14 DIAGNOSIS — Z1331 Encounter for screening for depression: Secondary | ICD-10-CM | POA: Diagnosis not present

## 2022-09-14 DIAGNOSIS — E785 Hyperlipidemia, unspecified: Secondary | ICD-10-CM | POA: Diagnosis not present

## 2022-09-14 DIAGNOSIS — Z79899 Other long term (current) drug therapy: Secondary | ICD-10-CM | POA: Diagnosis not present

## 2022-09-14 DIAGNOSIS — G5791 Unspecified mononeuropathy of right lower limb: Secondary | ICD-10-CM | POA: Diagnosis not present

## 2022-09-14 DIAGNOSIS — Z0000 Encounter for general adult medical examination without abnormal findings: Secondary | ICD-10-CM | POA: Diagnosis not present

## 2022-09-14 DIAGNOSIS — I10 Essential (primary) hypertension: Secondary | ICD-10-CM | POA: Diagnosis not present

## 2022-09-27 DIAGNOSIS — R140 Abdominal distension (gaseous): Secondary | ICD-10-CM | POA: Diagnosis not present

## 2022-09-27 DIAGNOSIS — K219 Gastro-esophageal reflux disease without esophagitis: Secondary | ICD-10-CM | POA: Diagnosis not present

## 2022-11-23 DIAGNOSIS — U071 COVID-19: Secondary | ICD-10-CM | POA: Diagnosis not present

## 2022-11-23 DIAGNOSIS — Z8709 Personal history of other diseases of the respiratory system: Secondary | ICD-10-CM | POA: Diagnosis not present

## 2022-11-23 DIAGNOSIS — Z03818 Encounter for observation for suspected exposure to other biological agents ruled out: Secondary | ICD-10-CM | POA: Diagnosis not present

## 2022-12-19 ENCOUNTER — Other Ambulatory Visit: Payer: Self-pay

## 2022-12-19 DIAGNOSIS — H2513 Age-related nuclear cataract, bilateral: Secondary | ICD-10-CM | POA: Insufficient documentation

## 2022-12-19 DIAGNOSIS — I10 Essential (primary) hypertension: Secondary | ICD-10-CM | POA: Insufficient documentation

## 2022-12-19 DIAGNOSIS — E782 Mixed hyperlipidemia: Secondary | ICD-10-CM | POA: Insufficient documentation

## 2022-12-19 DIAGNOSIS — K219 Gastro-esophageal reflux disease without esophagitis: Secondary | ICD-10-CM | POA: Insufficient documentation

## 2022-12-19 DIAGNOSIS — I493 Ventricular premature depolarization: Secondary | ICD-10-CM | POA: Insufficient documentation

## 2022-12-25 NOTE — Progress Notes (Unsigned)
Cardiology Office Note  Date:  12/26/2022   ID:  Innocence, Sosna 12-16-1945, MRN [id number]  PCP:  Danella Penton, MD   Chief Complaint  Patient presents with   New Patient (Initial Visit)    Ref by Dr. Bethann Punches to establish care for PVC's. Patient c/o bilateral numbness with walking, discoloration on the back of legs and frequent PVC's with feeling faint depending on stress level. Medications reviewed by the patient verbally.     HPI:  Ms. Kaitlyn Ramirez is a 77 year old woman with past medical history of Hypertension Hyperlipidemia GERD  PVCs, dating back 20 years Who presents by referral from Dr. Hyacinth Meeker for consultation of her PVCs  Long hx of PVCs dating back several decades Feels that the PVCs are stronger, sometimes feels that she is going to pass out when she has the beats  No recent monitor results available  Covid 5/24, minimal symptoms  Active at baseline, good exercise tolerance, denies shortness of breath on exertion No significant leg edema, no PND orthopnea  Orthostatics in the office today negative, blood pressure 118 up to 130 with standing  Prior cardiac records reviewed Echo done through [facility] July 2022 NORMAL LEFT VENTRICULAR SYSTOLIC FUNCTION  NORMAL RIGHT VENTRICULAR SYSTOLIC FUNCTION  MODERATE VALVULAR REGURGITATION (See above) pulmonic NO VALVULAR STENOSIS  ESTIMATED LVEF >55%  Aortic: TRIVIAL AI  Mitral: MILD MR  Tricuspid: MILD TR (2.15m/s)  Pulmonic: MODERATE PI   Stress test July 2022 Normal Myoview  EKG personally reviewed by myself on todays visit Normal sinus rhythm rate 63 bpm no significant ST-T wave changes  PMH:   has a past medical history of Anemia, Arthritis, Dyspnea, Dysrhythmia (2015), GERD (gastroesophageal reflux disease), Hypercholesteremia, Hyperlipidemia, Irritable bowel, Malaise and fatigue, Obesity, Phlebitis, and Sinusitis.  PSH:    Past Surgical History:  Procedure Laterality Date   ABDOMINAL HYSTERECTOMY      APPENDECTOMY     BREAST SURGERY     breast biopsies   COLONOSCOPY     COLONOSCOPY WITH PROPOFOL N/A 10/18/2016   Procedure: COLONOSCOPY WITH PROPOFOL;  Surgeon: Scot Jun, MD;  Location[facility] ENDOSCOPY;  Service: Endoscopy;  Laterality: N/A;   COLONOSCOPY WITH PROPOFOL N/A 12/08/2021   Procedure: COLONOSCOPY WITH PROPOFOL;  Surgeon: Jaynie Collins, DO;  Location: [facility] ENDOSCOPY;  Service: Gastroenterology;  Laterality: N/A;   ESOPHAGOGASTRODUODENOSCOPY     ESOPHAGOGASTRODUODENOSCOPY (EGD) WITH PROPOFOL N/A 12/08/2021   Procedure: ESOPHAGOGASTRODUODENOSCOPY (EGD) WITH PROPOFOL;  Surgeon: Jaynie Collins, DO;  Location: [facility] ENDOSCOPY;  Service: Gastroenterology;  Laterality: N/A;   EYE SURGERY Bilateral 10/05/2012   iridectomy peripheral for glaucoma   LIPOMA EXCISION  09/19/2018   Back    Current Outpatient Medications  Medication Sig Dispense Refill   ACIDOPHILUS LACTOBACILLUS PO Take 1 capsule by mouth daily.     amLODipine (NORVASC) 5 MG tablet Take 5 mg by mouth daily.     Artificial Tear Solution (TEARS NATURALE FORTE OP) Apply 1 drop to eye every 4 (four) hours as needed (for dry eyes).     Calcium Carbonate-Vitamin D (CALCIUM HIGH POTENCY/VITAMIN D) 600-200 MG-UNIT TABS Take by mouth.     fluticasone (FLONASE) 50 MCG/ACT nasal spray Place 2 sprays into both nostrils daily.     ipratropium (ATROVENT) 0.06 % nasal spray Place 2 sprays into both nostrils 3 (three) times daily as needed for rhinitis.     LACTOBACILLUS RHAMNOSUS, GG, PO Take by mouth daily.     latanoprost (XALATAN) 0.005 % ophthalmic  solution Place 1 drop into both eyes at bedtime.     Magnesium Oxide 250 MG TABS Take by mouth.     mirabegron ER (MYRBETRIQ) 25 MG TB24 tablet Take 25 mg by mouth daily.     OMEGA-3 FATTY ACIDS PO Take 1 capsule by mouth daily. 340-1,000mg      pantoprazole (PROTONIX) 40 MG tablet Take 40 mg by mouth daily.     pravastatin (PRAVACHOL) 10 MG tablet Take 10 mg by  mouth every evening.     dicyclomine (BENTYL) 20 MG tablet Take 20 mg by mouth every 6 (six) hours. (Patient not taking: Reported on 12/26/2022)     No current facility-administered medications for this visit.    Allergies:   Amoxicillin and Sulfa antibiotics   Social History:  The patient  reports that she quit smoking about 43 years ago. Her smoking use included cigarettes. She has never used smokeless tobacco. She reports that she does not drink alcohol and does not use drugs.   Family History:   family history includes Cancer in her mother; Coronary artery disease in her brother; Hyperlipidemia in her sister; Hypertension in her brother, brother, brother, sister, and sister.    Review of Systems: Review of Systems  Constitutional: Negative.   HENT: Negative.    Respiratory: Negative.    Cardiovascular: Negative.   Gastrointestinal: Negative.   Musculoskeletal: Negative.   Neurological: Negative.   Psychiatric/Behavioral: Negative.    All other systems reviewed and are negative.    PHYSICAL EXAM: VS:  BP 120/60 (BP Location: Right Arm, Patient Position: Sitting, Cuff Size: Normal)   Pulse 63   Ht 5' 8.5" (1.74 m)   Wt 154 lb 6 oz (70 kg)   SpO2 98%   BMI 23.13 kg/m  , BMI Body mass index is 23.13 kg/m. GEN: Well nourished, well developed, in no acute distress HEENT: normal Neck: no JVD, carotid bruits, or masses Cardiac: RRR; no murmurs, rubs, or gallops,no edema  Respiratory:  clear to auscultation bilaterally, normal work of breathing GI: soft, nontender, nondistended, + BS MS: no deformity or atrophy Skin: warm and dry, no rash Neuro:  Strength and sensation are intact Psych: euthymic mood, full affect  Recent Labs: No results found for requested labs within last 365 days.    Lipid Panel No results found for: "CHOL", "HDL", "LDLCALC", "TRIG"    Wt Readings from Last 3 Encounters:  12/26/22 154 lb 6 oz (70 kg)  12/08/21 170 lb (77.1 kg)  09/19/18 169 lb  3.2 oz (76.7 kg)       ASSESSMENT AND PLAN:  Problem List Items Addressed This Visit       Cardiology Problems   Essential hypertension   Relevant Orders   EKG 12-Lead   LONG TERM MONITOR (3-14 DAYS)   PVC's (premature ventricular contractions) - Primary   Relevant Orders   EKG 12-Lead   LONG TERM MONITOR (3-14 DAYS)   Other Visit Diagnoses     Near syncope          PVCs Reports long history of PVCs, previously tried diltiazem 30 every 6, also tried metoprolol succinate 25 daily but had side effects and is currently not taking these medications Normal EKG today We have recommended Zio monitor to look at PVC burden and rule out other arrhythmia given near syncope when she has palpitations, unable to exclude pauses Recommend she decrease amlodipine down to 2.5 mg daily  Essential hypertension Recommend she decrease amlodipine down to 2.5 daily  given near syncope spells Significant weight loss over the past several years contributing to lower blood pressure  Hyperlipidemia Continue statin  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 [facility] [place], [place] [phone]

## 2022-12-26 ENCOUNTER — Ambulatory Visit (INDEPENDENT_AMBULATORY_CARE_PROVIDER_SITE_OTHER): Payer: Medicare HMO

## 2022-12-26 ENCOUNTER — Encounter: Payer: Self-pay | Admitting: Cardiovascular Disease

## 2022-12-26 ENCOUNTER — Ambulatory Visit: Payer: Medicare HMO | Attending: Cardiovascular Disease | Admitting: Cardiovascular Disease

## 2022-12-26 VITALS — BP 120/60 | HR 63 | Ht 68.5 in | Wt 154.4 lb

## 2022-12-26 DIAGNOSIS — I10 Essential (primary) hypertension: Secondary | ICD-10-CM | POA: Diagnosis not present

## 2022-12-26 DIAGNOSIS — I493 Ventricular premature depolarization: Secondary | ICD-10-CM

## 2022-12-26 DIAGNOSIS — R55 Syncope and collapse: Secondary | ICD-10-CM

## 2022-12-26 NOTE — Patient Instructions (Addendum)
Medication Instructions:  No changes  If you need a refill on your cardiac medications before your next appointment, please call your pharmacy.   Lab work: No new labs needed  Testing/Procedures: ZIO XT- Long Term Monitor Instructions  Your physician has requested you wear a ZIO patch monitor for 14 days.  This is a single patch monitor. [employer] supplies one patch monitor per enrollment. Additional stickers are not available. Please do not apply patch if you will be having a Nuclear Stress Test,  Echocardiogram, Cardiac CT, MRI, or Chest Xray during the period you would be wearing the  monitor. The patch cannot be worn during these tests. You cannot remove and re-apply the  ZIO XT patch monitor.  Your ZIO patch monitor will be mailed 3 day USPS to your address on file. It may take 3-5 days  to receive your monitor after you have been enrolled.  Once you have received your monitor, please review the enclosed instructions. Your monitor  has already been registered assigning a specific monitor serial # to you.  Billing and Patient Assistance Program Information  We have supplied [employer] with any of your insurance information on file for billing purposes. [employer] offers a sliding scale Patient Assistance Program for patients that do not have  insurance, or whose insurance does not completely cover the cost of the ZIO monitor.  You must apply for the Patient Assistance Program to qualify for this discounted rate.  To apply, please call [employer] at [phone], select option 4, select option 2, ask to apply for  Patient Assistance Program. Meredeth Ide will ask your household income, and how many people  are in your household. They will quote your out-of-pocket cost based on that information.  [employer] will also be able to set up a 74-month, interest-free payment plan if needed.  Applying the monitor   Shave hair from upper left chest.  Hold abrader disc by orange tab. Rub abrader in 40  strokes over the upper left chest as  indicated in your monitor instructions.  Clean area with 4 enclosed alcohol pads. Let dry.  Apply patch as indicated in monitor instructions. Patch will be placed under collarbone on left  side of chest with arrow pointing upward.  Rub patch adhesive wings for 2 minutes. Remove white label marked "1". Remove the white  label marked "2". Rub patch adhesive wings for 2 additional minutes.  While looking in a mirror, press and release button in center of patch. A small green light will  flash 3-4 times. This will be your only indicator that the monitor has been turned on.  Do not shower for the first 24 hours. You may shower after the first 24 hours.  Press the button if you feel a symptom. You will hear a small click. Record Date, Time and  Symptom in the Patient Logbook.  When you are ready to remove the patch, follow instructions on the last 2 pages of Patient  Logbook. Stick patch monitor onto the last page of Patient Logbook.  Place Patient Logbook in the blue and white box. Use locking tab on box and tape box closed  securely. The blue and white box has prepaid postage on it. Please place it in the mailbox as  soon as possible. Your physician should have your test results approximately 7 days after the  monitor has been mailed back to [facility].  Call [facility] Customer Care at [phone] if you have questions regarding  your ZIO XT patch monitor. Call  them immediately if you see an orange light blinking on your  monitor.  If your monitor falls off in less than 4 days, contact our Monitor department at [phone].  If your monitor becomes loose or falls off after 4 days call [employer] at [phone] for  suggestions on securing your monitor   Follow-Up: At [facility], you and your health needs are our priority.  As part of our continuing mission to provide you with exceptional heart care, we have created designated Provider Care  Teams.  These Care Teams include your primary Cardiologist (physician) and Advanced Practice Providers (APPs -  Physician Assistants and Nurse Practitioners) who all work together to provide you with the care you need, when you need it.  You will need a follow up appointment in 10 weeks   Providers on your designated Care Team:   Nicolasa Ducking, NP Eula Listen, PA-C Cadence Fransico Michael, [place]  COVID-19 Vaccine Information can be found at: [email or web address] For questions related to vaccine distribution or appointments, please email [email or web address] or call [phone].

## 2023-01-08 ENCOUNTER — Other Ambulatory Visit (INDEPENDENT_AMBULATORY_CARE_PROVIDER_SITE_OTHER): Payer: Self-pay | Admitting: Nurse Practitioner

## 2023-01-08 DIAGNOSIS — I83893 Varicose veins of bilateral lower extremities with other complications: Secondary | ICD-10-CM

## 2023-01-12 ENCOUNTER — Encounter (INDEPENDENT_AMBULATORY_CARE_PROVIDER_SITE_OTHER): Payer: Self-pay | Admitting: Nurse Practitioner

## 2023-01-12 ENCOUNTER — Ambulatory Visit (INDEPENDENT_AMBULATORY_CARE_PROVIDER_SITE_OTHER): Payer: Medicare HMO

## 2023-01-12 ENCOUNTER — Ambulatory Visit (INDEPENDENT_AMBULATORY_CARE_PROVIDER_SITE_OTHER): Payer: Medicare HMO | Admitting: Nurse Practitioner

## 2023-01-12 VITALS — BP 128/78 | HR 55 | Resp 18 | Ht 69.0 in | Wt 154.8 lb

## 2023-01-12 DIAGNOSIS — I83813 Varicose veins of bilateral lower extremities with pain: Secondary | ICD-10-CM

## 2023-01-12 DIAGNOSIS — E782 Mixed hyperlipidemia: Secondary | ICD-10-CM | POA: Diagnosis not present

## 2023-01-12 DIAGNOSIS — I83893 Varicose veins of bilateral lower extremities with other complications: Secondary | ICD-10-CM

## 2023-01-12 DIAGNOSIS — I10 Essential (primary) hypertension: Secondary | ICD-10-CM

## 2023-01-13 NOTE — Progress Notes (Addendum)
Subjective:    Patient ID: Kaitlyn Ramirez, female    DOB: 1946-05-03, 77 y.o.   MRN: [id number] Chief Complaint  Patient presents with   New Patient (Initial Visit)    Room 10 -NP. reflux/consult. symptomatic BLE VVs. miller    The patient returns for followup evaluation several years after the initial visit. The patient continues to have pain in the lower extremities with dependency. The pain is lessened with elevation. Graduated compression stockings, Class I (20-30 mmHg), have been worn but the stockings do not eliminate the leg pain. Over-the-counter analgesics do not improve the symptoms. The degree of discomfort continues to interfere with daily activities. The patient notes the pain in the legs is causing problems with daily exercise, at the workplace and even with household activities and maintenance such as standing in the kitchen preparing meals and doing dishes.   Venous ultrasound shows normal deep venous system, no evidence of acute or chronic DVT.  No superficial venous reflux noted bilaterally.    Review of Systems  Cardiovascular:  Positive for leg swelling.  All other systems reviewed and are negative.      Objective:   Physical Exam Vitals reviewed.  HENT:     Head: Normocephalic.  Cardiovascular:     Rate and Rhythm: Normal rate.     Pulses: Normal pulses.  Pulmonary:     Effort: Pulmonary effort is normal.  Musculoskeletal:        General: Tenderness present.  Skin:    General: Skin is warm and dry.  Neurological:     Mental Status: She is alert and oriented to person, place, and time.  Psychiatric:        Mood and Affect: Mood normal.        Behavior: Behavior normal.        Thought Content: Thought content normal.        Judgment: Judgment normal.     BP 128/78 (BP Location: Right Arm)   Pulse (!) 55   Resp 18   Ht 5\' 9"  (1.753 m)   Wt 154 lb 12.8 oz (70.2 kg)   BMI 22.86 kg/m   Past Medical History:  Diagnosis Date   Anemia     Arthritis    Dyspnea    Dysrhythmia 2015   heart palpatations, irregular rhythm   GERD (gastroesophageal reflux disease)    Hypercholesteremia    Hyperlipidemia    Irritable bowel    Malaise and fatigue    Obesity    Phlebitis    Sinusitis     Social History   Socioeconomic History   Marital status: Married    Spouse name: Not on file   Number of children: 1   Years of education: Not on file   Highest education level: Not on file  Occupational History   Not on file  Tobacco Use   Smoking status: Former    Types: Cigarettes    Quit date: 1981    Years since quitting: 43.5   Smokeless tobacco: Never  Vaping Use   Vaping Use: Never used  Substance and Sexual Activity   Alcohol use: No   Drug use: No   Sexual activity: Not on file  Other Topics Concern   Not on file  Social History Narrative   Not on file   Social Determinants of Health   Financial Resource Strain: Not on file  Food Insecurity: Not on file  Transportation Needs: Not on file  Physical Activity: Not  on file  Stress: Not on file  Social Connections: Not on file  Intimate Partner Violence: Not on file    Past Surgical History:  Procedure Laterality Date   ABDOMINAL HYSTERECTOMY     APPENDECTOMY     BREAST SURGERY     breast biopsies   COLONOSCOPY     COLONOSCOPY WITH PROPOFOL N/A 10/18/2016   Procedure: COLONOSCOPY WITH PROPOFOL;  Surgeon: Scot Jun, MD;  Location[facility] ENDOSCOPY;  Service: Endoscopy;  Laterality: N/A;   COLONOSCOPY WITH PROPOFOL N/A 12/08/2021   Procedure: COLONOSCOPY WITH PROPOFOL;  Surgeon: Jaynie Collins, DO;  Location: [facility] ENDOSCOPY;  Service: Gastroenterology;  Laterality: N/A;   ESOPHAGOGASTRODUODENOSCOPY     ESOPHAGOGASTRODUODENOSCOPY (EGD) WITH PROPOFOL N/A 12/08/2021   Procedure: ESOPHAGOGASTRODUODENOSCOPY (EGD) WITH PROPOFOL;  Surgeon: Jaynie Collins, DO;  Location: [facility] ENDOSCOPY;  Service: Gastroenterology;  Laterality: N/A;   EYE SURGERY  Bilateral 10/05/2012   iridectomy peripheral for glaucoma   LIPOMA EXCISION  09/19/2018   Back    Family History  Problem Relation Age of Onset   Cancer Mother    Hypertension Sister    Hyperlipidemia Sister    Hypertension Sister    Hypertension Brother    Hypertension Brother    Coronary artery disease Brother    Hypertension Brother     Allergies  Allergen Reactions   Amoxicillin Nausea And Vomiting   Sulfa Antibiotics        Latest Ref Rng & Units 04/28/2012    3:22 AM  CBC  WBC 3.6 - 11.0 x10 3/mm 3 6.2   Hemoglobin 12.0 - 16.0 g/dL 01.0   Hematocrit 27.2 - 47.0 % 35.4   Platelets 150 - 440 x10 3/mm 3 223       CMP     Component Value Date/Time   NA 144 04/28/2012 0322   K 3.5 04/28/2012 0322   CL 110 (H) 04/28/2012 0322   CO2 27 04/28/2012 0322   GLUCOSE 88 04/28/2012 0322   BUN 17 04/28/2012 0322   CREATININE 0.84 04/28/2012 0322   CALCIUM 9.0 04/28/2012 0322   PROT 7.6 04/28/2012 0322   ALBUMIN 3.9 04/28/2012 0322   AST 24 04/28/2012 0322   ALT 20 04/28/2012 0322   ALKPHOS 85 04/28/2012 0322   BILITOT 0.3 04/28/2012 0322   GFRNONAA >60 04/28/2012 0322     No results found.     Assessment & Plan:   1. Varicose veins of both lower extremities with pain Recommend:  The patient has persistent symptoms of pain and swelling that are having a negative impact on daily life and daily activities.The patient is CEAP [id number].  Patient should undergo injection sclerotherapy to treat the residual varicosities.  The risks, benefits and alternative therapies were reviewed in detail with the patient.  All questions were answered.  The patient agrees to proceed with sclerotherapy at their convenience.  The patient will continue wearing the graduated compression stockings and using the over-the-counter pain medications to treat her symptoms.      2. Essential hypertension Continue antihypertensive medications as already ordered, these medications  have been reviewed and there are no changes at this time.  3. Hyperlipidemia, mixed Continue statin as ordered and reviewed, no changes at this time   Current Outpatient Medications on File Prior to Visit  Medication Sig Dispense Refill   ACIDOPHILUS LACTOBACILLUS PO Take 1 capsule by mouth daily.     amLODipine (NORVASC) 5 MG tablet Take 5 mg by mouth daily.  Artificial Tear Solution (TEARS NATURALE FORTE OP) Apply 1 drop to eye every 4 (four) hours as needed (for dry eyes).     Calcium Carbonate-Vitamin D (CALCIUM HIGH POTENCY/VITAMIN D) 600-200 MG-UNIT TABS Take by mouth.     fluticasone (FLONASE) 50 MCG/ACT nasal spray Place 2 sprays into both nostrils daily.     ipratropium (ATROVENT) 0.06 % nasal spray Place 2 sprays into both nostrils 3 (three) times daily as needed for rhinitis.     LACTOBACILLUS RHAMNOSUS, GG, PO Take by mouth daily.     latanoprost (XALATAN) 0.005 % ophthalmic solution Place 1 drop into both eyes at bedtime.     Magnesium Oxide 250 MG TABS Take by mouth.     mirabegron ER (MYRBETRIQ) 25 MG TB24 tablet Take 25 mg by mouth daily.     OMEGA-3 FATTY ACIDS PO Take 1 capsule by mouth daily. 340-1,000mg      pantoprazole (PROTONIX) 40 MG tablet Take 40 mg by mouth daily.     pravastatin (PRAVACHOL) 10 MG tablet Take 10 mg by mouth every evening.     No current facility-administered medications on file prior to visit.    There are no Patient Instructions on file for this visit. No follow-ups on file.   Georgiana Spinner, NP

## 2023-01-15 DIAGNOSIS — I493 Ventricular premature depolarization: Secondary | ICD-10-CM

## 2023-01-15 DIAGNOSIS — I10 Essential (primary) hypertension: Secondary | ICD-10-CM | POA: Diagnosis not present

## 2023-01-23 DIAGNOSIS — Z832 Family history of diseases of the blood and blood-forming organs and certain disorders involving the immune mechanism: Secondary | ICD-10-CM | POA: Diagnosis not present

## 2023-01-23 DIAGNOSIS — I493 Ventricular premature depolarization: Secondary | ICD-10-CM | POA: Diagnosis not present

## 2023-01-23 DIAGNOSIS — R200 Anesthesia of skin: Secondary | ICD-10-CM | POA: Diagnosis not present

## 2023-01-23 DIAGNOSIS — R5383 Other fatigue: Secondary | ICD-10-CM | POA: Diagnosis not present

## 2023-01-29 ENCOUNTER — Telehealth: Payer: Self-pay | Admitting: Cardiovascular Disease

## 2023-01-29 NOTE — Telephone Encounter (Signed)
Called and spoke with patient. Patient states that she was not able to wear the monitor until 01/15/23. Patient states that she took the monitor yesterday and will be mailing it back today.

## 2023-01-29 NOTE — Telephone Encounter (Signed)
Email notification received from [employer] stating this patient's ZIO XT is still pending return.  Serial # [id number]  Reviewed the patient's chart.  She had a ZIO XT ordered on 12/26/22 for PVC's.  Will forward to Dr. Windell Hummingbird nurse as an Lorain Childes and for follow up with the patient regarding her monitor.

## 2023-02-01 DIAGNOSIS — I493 Ventricular premature depolarization: Secondary | ICD-10-CM | POA: Diagnosis not present

## 2023-02-01 DIAGNOSIS — I10 Essential (primary) hypertension: Secondary | ICD-10-CM | POA: Diagnosis not present

## 2023-02-19 ENCOUNTER — Ambulatory Visit (INDEPENDENT_AMBULATORY_CARE_PROVIDER_SITE_OTHER): Payer: Medicare HMO | Admitting: Nurse Practitioner

## 2023-02-19 ENCOUNTER — Encounter (INDEPENDENT_AMBULATORY_CARE_PROVIDER_SITE_OTHER): Payer: Self-pay | Admitting: Nurse Practitioner

## 2023-02-19 VITALS — BP 136/71 | HR 65 | Resp 16 | Wt 155.4 lb

## 2023-02-19 DIAGNOSIS — I83813 Varicose veins of bilateral lower extremities with pain: Secondary | ICD-10-CM

## 2023-02-19 NOTE — Progress Notes (Signed)
Varicose veins of bilateral  lower extremity with inflammation (454.1  I83.10) Current Plans   Indication: Patient presents with symptomatic varicose veins of the bilateral  lower extremity.   Procedure: Sclerotherapy using hypertonic saline mixed with 1% Lidocaine was performed on the bilateral lower extremity. Compression wraps were placed. The patient tolerated the procedure well. 

## 2023-02-26 DIAGNOSIS — R200 Anesthesia of skin: Secondary | ICD-10-CM | POA: Diagnosis not present

## 2023-03-08 DIAGNOSIS — E782 Mixed hyperlipidemia: Secondary | ICD-10-CM | POA: Diagnosis not present

## 2023-03-10 NOTE — Progress Notes (Unsigned)
Cardiology Office Note  Date:  03/12/2023   ID:  Kaitlyn Ramirez 12/30/1945, MRN [id number]  PCP:  Danella Penton, MD   Chief Complaint  Patient presents with   10 week follow up     Follow up Zio monitor. "Doing well."  Medications reviewed by the patient verbally.     HPI:  Kaitlyn Ramirez is a 77 year old woman with past medical history of Hypertension Hyperlipidemia GERD  PVCs, dating back 20 years Who presentsfor f/u of her  PVCs  Last seen in clinic by myself June 2024  Zio Monitor Doreatha Massed monitor Very low number of PVCs Rare very short runs of tachycardia  In general symptoms have been relatively well-controlled, not on beta-blockers or calcium channel blockers  Lab work reviewed from primary care Total cholesterol 182, LDL 119  Significant stress at home, taking care of her husband, caretaker burnout  Covid 5/24, minimal symptoms  On prior clinic visit discussed decreasing amlodipine down to 2.5 daily given lower blood pressure, orthostasis symptoms, weight loss  Prior cardiac records reviewed Echo done through [facility] July 2022 NORMAL LEFT VENTRICULAR SYSTOLIC FUNCTION  NORMAL RIGHT VENTRICULAR SYSTOLIC FUNCTION  MODERATE VALVULAR REGURGITATION (See above) pulmonic NO VALVULAR STENOSIS  ESTIMATED LVEF >55%  Aortic: TRIVIAL AI  Mitral: MILD MR  Tricuspid: MILD TR (2.33m/s)  Pulmonic: MODERATE PI   Stress test July 2022 Normal Myoview   PMH:   has a past medical history of Anemia, Arthritis, Dyspnea, Dysrhythmia (2015), GERD (gastroesophageal reflux disease), Hypercholesteremia, Hyperlipidemia, Irritable bowel, Malaise and fatigue, Obesity, Phlebitis, and Sinusitis.  PSH:    Past Surgical History:  Procedure Laterality Date   ABDOMINAL HYSTERECTOMY     APPENDECTOMY     BREAST SURGERY     breast biopsies   COLONOSCOPY     COLONOSCOPY WITH PROPOFOL N/A 10/18/2016   Procedure: COLONOSCOPY WITH PROPOFOL;  Surgeon: Scot Jun, MD;   Location: [facility] ENDOSCOPY;  Service: Endoscopy;  Laterality: N/A;   COLONOSCOPY WITH PROPOFOL N/A 12/08/2021   Procedure: COLONOSCOPY WITH PROPOFOL;  Surgeon: Jaynie Collins, DO;  Location: [facility] ENDOSCOPY;  Service: Gastroenterology;  Laterality: N/A;   ESOPHAGOGASTRODUODENOSCOPY     ESOPHAGOGASTRODUODENOSCOPY (EGD) WITH PROPOFOL N/A 12/08/2021   Procedure: ESOPHAGOGASTRODUODENOSCOPY (EGD) WITH PROPOFOL;  Surgeon: Jaynie Collins, DO;  Location: [facility] ENDOSCOPY;  Service: Gastroenterology;  Laterality: N/A;   EYE SURGERY Bilateral 10/05/2012   iridectomy peripheral for glaucoma   LIPOMA EXCISION  09/19/2018   Back    Current Outpatient Medications  Medication Sig Dispense Refill   ACIDOPHILUS LACTOBACILLUS PO Take 1 capsule by mouth daily.     amLODipine (NORVASC) 5 MG tablet Take 5 mg by mouth daily.     Artificial Tear Solution (TEARS NATURALE FORTE OP) Apply 1 drop to eye every 4 (four) hours as needed (for dry eyes).     b complex vitamins capsule Take 1 capsule by mouth daily.     fluticasone (FLONASE) 50 MCG/ACT nasal spray Place 2 sprays into both nostrils daily.     ipratropium (ATROVENT) 0.06 % nasal spray Place 2 sprays into both nostrils 3 (three) times daily as needed for rhinitis.     LACTOBACILLUS RHAMNOSUS, GG, PO Take by mouth daily.     latanoprost (XALATAN) 0.005 % ophthalmic solution Place 1 drop into both eyes at bedtime.     Magnesium Oxide 250 MG TABS Take by mouth.     mirabegron ER (MYRBETRIQ) 25 MG TB24 tablet Take 25 mg by mouth  daily.     OMEGA-3 FATTY ACIDS PO Take 1 capsule by mouth daily. 340-1,000mg      pantoprazole (PROTONIX) 40 MG tablet Take 40 mg by mouth daily.     pravastatin (PRAVACHOL) 10 MG tablet Take 10 mg by mouth every evening.     Vitamin D, Cholecalciferol, 25 MCG (1000 UT) TABS Take 2,000 Units by mouth daily.     No current facility-administered medications for this visit.    Allergies:   Amoxicillin and Sulfa antibiotics    Social History:  The patient  reports that she quit smoking about 43 years ago. Her smoking use included cigarettes. She has never used smokeless tobacco. She reports that she does not drink alcohol and does not use drugs.   Family History:   family history includes Cancer in her mother; Coronary artery disease in her brother; Hyperlipidemia in her sister; Hypertension in her brother, brother, brother, sister, and sister.    Review of Systems: Review of Systems  Constitutional: Negative.   HENT: Negative.    Respiratory: Negative.    Cardiovascular: Negative.   Gastrointestinal: Negative.   Musculoskeletal: Negative.   Neurological: Negative.   Psychiatric/Behavioral: Negative.    All other systems reviewed and are negative.   PHYSICAL EXAM: VS:  BP (!) 120/52 (BP Location: Left Arm, Patient Position: Sitting, Cuff Size: Normal)   Pulse 69   Ht 5' 8.5" (1.74 m)   Wt 159 lb 8 oz (72.3 kg)   SpO2 98%   BMI 23.90 kg/m  , BMI Body mass index is 23.9 kg/m. Constitutional:  oriented to person, place, and time. No distress.  HENT:  Head: Grossly normal Eyes:  no discharge. No scleral icterus.  Neck: No JVD, no carotid bruits  Cardiovascular: Regular rate and rhythm, no murmurs appreciated Pulmonary/Chest: Clear to auscultation bilaterally, no wheezes or rails Abdominal: Soft.  no distension.  no tenderness.  Musculoskeletal: Normal range of motion Neurological:  normal muscle tone. Coordination normal. No atrophy Skin: Skin warm and dry Psychiatric: normal affect, pleasant  Recent Labs: No results found for requested labs within last 365 days.    Lipid Panel No results found for: "CHOL", "HDL", "LDLCALC", "TRIG"    Wt Readings from Last 3 Encounters:  03/12/23 159 lb 8 oz (72.3 kg)  02/19/23 155 lb 6.4 oz (70.5 kg)  01/12/23 154 lb 12.8 oz (70.2 kg)     ASSESSMENT AND PLAN:  Problem List Items Addressed This Visit       Cardiology Problems   Essential  hypertension - Primary   Relevant Orders   EKG 12-Lead (Completed)   Hyperlipidemia, mixed   PVC's (premature ventricular contractions)   Relevant Orders   EKG 12-Lead (Completed)   Other Visit Diagnoses     Near syncope       Relevant Orders   EKG 12-Lead (Completed)       PVCs Zio Monitor showing rare PVCs previously tried diltiazem 30 every 6, also tried metoprolol succinate 25 daily but had side effects and is currently not taking these medications Normal EKG today Possibly exacerbated by stress at home, take care of her husband No changes to medications made  Essential hypertension Recommend decreasing amlodipine 2.5 daily given lower blood pressure through weight loss Significant weight loss over the past several years contributing to lower blood pressure  Hyperlipidemia Continue statin  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 [facility] [place], [place] [phone]

## 2023-03-12 ENCOUNTER — Encounter: Payer: Self-pay | Admitting: Cardiovascular Disease

## 2023-03-12 ENCOUNTER — Ambulatory Visit: Payer: Medicare HMO | Attending: Cardiovascular Disease | Admitting: Cardiovascular Disease

## 2023-03-12 VITALS — BP 120/52 | HR 69 | Ht 68.5 in | Wt 159.5 lb

## 2023-03-12 DIAGNOSIS — I10 Essential (primary) hypertension: Secondary | ICD-10-CM

## 2023-03-12 DIAGNOSIS — R55 Syncope and collapse: Secondary | ICD-10-CM

## 2023-03-12 DIAGNOSIS — I493 Ventricular premature depolarization: Secondary | ICD-10-CM | POA: Diagnosis not present

## 2023-03-12 DIAGNOSIS — E782 Mixed hyperlipidemia: Secondary | ICD-10-CM

## 2023-03-12 MED ORDER — AMLODIPINE BESYLATE 2.5 MG PO TABS: 2.5000 mg | ORAL_TABLET | Freq: Every day | ORAL | 6 refills | Status: DC

## 2023-03-12 MED ORDER — AMLODIPINE BESYLATE 2.5 MG PO TABS: 5.0000 mg | ORAL_TABLET | Freq: Every day | ORAL | Status: DC

## 2023-03-12 NOTE — Patient Instructions (Addendum)
Medication Instructions:  Try amlodipine 2.5 daily  If you need a refill on your cardiac medications before your next appointment, please call your pharmacy.   Lab work: No new labs needed  Testing/Procedures: No new testing needed  Follow-Up: At [facility], you and your health needs are our priority.  As part of our continuing mission to provide you with exceptional heart care, we have created designated Provider Care Teams.  These Care Teams include your primary Cardiologist (physician) and Advanced Practice Providers (APPs -  Physician Assistants and Nurse Practitioners) who all work together to provide you with the care you need, when you need it.  You will need a follow up appointment as needed  Providers on your designated Care Team:   Nicolasa Ducking, NP Eula Listen, PA-C Cadence Fransico Michael, [place]  COVID-19 Vaccine Information can be found at: [email or web address] For questions related to vaccine distribution or appointments, please email [email or web address] or call [phone].

## 2023-03-12 NOTE — Addendum Note (Signed)
Addended by: Jani Gravel on: 03/12/2023 02:12 PM   Modules accepted: Orders

## 2023-03-14 DIAGNOSIS — G608 Other hereditary and idiopathic neuropathies: Secondary | ICD-10-CM | POA: Insufficient documentation

## 2023-03-14 DIAGNOSIS — Z79899 Other long term (current) drug therapy: Secondary | ICD-10-CM | POA: Diagnosis not present

## 2023-03-14 DIAGNOSIS — E785 Hyperlipidemia, unspecified: Secondary | ICD-10-CM | POA: Diagnosis not present

## 2023-03-21 ENCOUNTER — Ambulatory Visit (INDEPENDENT_AMBULATORY_CARE_PROVIDER_SITE_OTHER): Payer: Medicare HMO | Admitting: Nurse Practitioner

## 2023-04-12 ENCOUNTER — Ambulatory Visit (INDEPENDENT_AMBULATORY_CARE_PROVIDER_SITE_OTHER): Payer: Medicare HMO | Admitting: Nurse Practitioner

## 2023-04-18 ENCOUNTER — Ambulatory Visit (INDEPENDENT_AMBULATORY_CARE_PROVIDER_SITE_OTHER): Payer: Medicare HMO | Admitting: Nurse Practitioner

## 2023-05-01 DIAGNOSIS — Z2989 Encounter for other specified prophylactic measures: Secondary | ICD-10-CM | POA: Diagnosis not present

## 2023-05-01 DIAGNOSIS — Z23 Encounter for immunization: Secondary | ICD-10-CM | POA: Diagnosis not present

## 2023-05-15 DIAGNOSIS — Z1231 Encounter for screening mammogram for malignant neoplasm of breast: Secondary | ICD-10-CM | POA: Diagnosis not present

## 2023-05-15 DIAGNOSIS — Z961 Presence of intraocular lens: Secondary | ICD-10-CM | POA: Diagnosis not present

## 2023-05-15 DIAGNOSIS — H40033 Anatomical narrow angle, bilateral: Secondary | ICD-10-CM | POA: Diagnosis not present

## 2023-05-15 DIAGNOSIS — H2512 Age-related nuclear cataract, left eye: Secondary | ICD-10-CM | POA: Diagnosis not present

## 2023-05-31 DIAGNOSIS — M25561 Pain in right knee: Secondary | ICD-10-CM | POA: Diagnosis not present

## 2023-05-31 DIAGNOSIS — R030 Elevated blood-pressure reading, without diagnosis of hypertension: Secondary | ICD-10-CM | POA: Diagnosis not present

## 2023-05-31 DIAGNOSIS — M25562 Pain in left knee: Secondary | ICD-10-CM | POA: Diagnosis not present

## 2023-05-31 DIAGNOSIS — M160 Bilateral primary osteoarthritis of hip: Secondary | ICD-10-CM | POA: Diagnosis not present

## 2023-05-31 DIAGNOSIS — G629 Polyneuropathy, unspecified: Secondary | ICD-10-CM | POA: Diagnosis not present

## 2023-05-31 DIAGNOSIS — M25551 Pain in right hip: Secondary | ICD-10-CM | POA: Diagnosis not present

## 2023-06-07 DIAGNOSIS — I471 Supraventricular tachycardia, unspecified: Secondary | ICD-10-CM | POA: Diagnosis not present

## 2023-06-28 DIAGNOSIS — M160 Bilateral primary osteoarthritis of hip: Secondary | ICD-10-CM | POA: Diagnosis not present

## 2023-07-04 DIAGNOSIS — L299 Pruritus, unspecified: Secondary | ICD-10-CM | POA: Diagnosis not present

## 2023-07-04 DIAGNOSIS — R32 Unspecified urinary incontinence: Secondary | ICD-10-CM | POA: Diagnosis not present

## 2023-07-09 DIAGNOSIS — M25551 Pain in right hip: Secondary | ICD-10-CM | POA: Diagnosis not present

## 2023-07-24 DIAGNOSIS — M25551 Pain in right hip: Secondary | ICD-10-CM | POA: Diagnosis not present

## 2023-08-01 DIAGNOSIS — M25551 Pain in right hip: Secondary | ICD-10-CM | POA: Diagnosis not present

## 2023-08-07 DIAGNOSIS — M25551 Pain in right hip: Secondary | ICD-10-CM | POA: Diagnosis not present

## 2023-08-14 DIAGNOSIS — M25551 Pain in right hip: Secondary | ICD-10-CM | POA: Diagnosis not present

## 2023-08-28 DIAGNOSIS — M25551 Pain in right hip: Secondary | ICD-10-CM | POA: Diagnosis not present

## 2023-08-30 DIAGNOSIS — D2271 Melanocytic nevi of right lower limb, including hip: Secondary | ICD-10-CM | POA: Diagnosis not present

## 2023-08-30 DIAGNOSIS — D2262 Melanocytic nevi of left upper limb, including shoulder: Secondary | ICD-10-CM | POA: Diagnosis not present

## 2023-08-30 DIAGNOSIS — D2261 Melanocytic nevi of right upper limb, including shoulder: Secondary | ICD-10-CM | POA: Diagnosis not present

## 2023-08-30 DIAGNOSIS — D2272 Melanocytic nevi of left lower limb, including hip: Secondary | ICD-10-CM | POA: Diagnosis not present

## 2023-08-30 DIAGNOSIS — D225 Melanocytic nevi of trunk: Secondary | ICD-10-CM | POA: Diagnosis not present

## 2023-08-30 DIAGNOSIS — I872 Venous insufficiency (chronic) (peripheral): Secondary | ICD-10-CM | POA: Diagnosis not present

## 2023-08-30 DIAGNOSIS — L821 Other seborrheic keratosis: Secondary | ICD-10-CM | POA: Diagnosis not present

## 2023-09-07 DIAGNOSIS — M25551 Pain in right hip: Secondary | ICD-10-CM | POA: Diagnosis not present

## 2023-09-12 DIAGNOSIS — M25551 Pain in right hip: Secondary | ICD-10-CM | POA: Diagnosis not present

## 2023-09-19 DIAGNOSIS — M25551 Pain in right hip: Secondary | ICD-10-CM | POA: Diagnosis not present

## 2023-09-26 DIAGNOSIS — M25551 Pain in right hip: Secondary | ICD-10-CM | POA: Diagnosis not present

## 2023-09-26 DIAGNOSIS — R531 Weakness: Secondary | ICD-10-CM | POA: Diagnosis not present

## 2023-09-26 DIAGNOSIS — R55 Syncope and collapse: Secondary | ICD-10-CM | POA: Diagnosis not present

## 2023-09-26 DIAGNOSIS — R399 Unspecified symptoms and signs involving the genitourinary system: Secondary | ICD-10-CM | POA: Diagnosis not present

## 2023-10-02 DIAGNOSIS — E782 Mixed hyperlipidemia: Secondary | ICD-10-CM | POA: Diagnosis not present

## 2023-10-02 DIAGNOSIS — R739 Hyperglycemia, unspecified: Secondary | ICD-10-CM | POA: Diagnosis not present

## 2023-10-09 DIAGNOSIS — R32 Unspecified urinary incontinence: Secondary | ICD-10-CM | POA: Diagnosis not present

## 2023-10-09 DIAGNOSIS — Z1331 Encounter for screening for depression: Secondary | ICD-10-CM | POA: Diagnosis not present

## 2023-10-09 DIAGNOSIS — G629 Polyneuropathy, unspecified: Secondary | ICD-10-CM | POA: Diagnosis not present

## 2023-10-09 DIAGNOSIS — Z0000 Encounter for general adult medical examination without abnormal findings: Secondary | ICD-10-CM | POA: Diagnosis not present

## 2023-10-30 DIAGNOSIS — M160 Bilateral primary osteoarthritis of hip: Secondary | ICD-10-CM | POA: Diagnosis not present

## 2023-10-30 DIAGNOSIS — R252 Cramp and spasm: Secondary | ICD-10-CM | POA: Diagnosis not present

## 2023-10-30 DIAGNOSIS — M25551 Pain in right hip: Secondary | ICD-10-CM | POA: Diagnosis not present

## 2023-10-30 DIAGNOSIS — I83893 Varicose veins of bilateral lower extremities with other complications: Secondary | ICD-10-CM | POA: Diagnosis not present

## 2023-10-30 DIAGNOSIS — G629 Polyneuropathy, unspecified: Secondary | ICD-10-CM | POA: Diagnosis not present

## 2023-11-15 DIAGNOSIS — N3941 Urge incontinence: Secondary | ICD-10-CM | POA: Diagnosis not present

## 2023-11-15 DIAGNOSIS — R351 Nocturia: Secondary | ICD-10-CM | POA: Diagnosis not present

## 2023-11-15 DIAGNOSIS — R350 Frequency of micturition: Secondary | ICD-10-CM | POA: Diagnosis not present

## 2023-12-18 DIAGNOSIS — R350 Frequency of micturition: Secondary | ICD-10-CM | POA: Diagnosis not present

## 2023-12-18 DIAGNOSIS — N3941 Urge incontinence: Secondary | ICD-10-CM | POA: Diagnosis not present

## 2023-12-26 DIAGNOSIS — N3941 Urge incontinence: Secondary | ICD-10-CM | POA: Diagnosis not present

## 2023-12-26 DIAGNOSIS — R350 Frequency of micturition: Secondary | ICD-10-CM | POA: Diagnosis not present

## 2024-01-04 DIAGNOSIS — M1611 Unilateral primary osteoarthritis, right hip: Secondary | ICD-10-CM | POA: Diagnosis not present

## 2024-01-14 DIAGNOSIS — R5383 Other fatigue: Secondary | ICD-10-CM | POA: Diagnosis not present

## 2024-01-14 DIAGNOSIS — R1319 Other dysphagia: Secondary | ICD-10-CM | POA: Diagnosis not present

## 2024-02-06 DIAGNOSIS — M1611 Unilateral primary osteoarthritis, right hip: Secondary | ICD-10-CM | POA: Diagnosis not present

## 2024-02-10 NOTE — H&P (Signed)
 ORTHOPAEDIC HISTORY & PHYSICAL Drake Fonda L[place], [place] - 02/06/2024 11:15 AM EDT Formatting of this note is different from the original. NAME: Kaitlyn Ramirez H&P Date: 02/06/2024 Procedure Date: 02/18/2024  Chief Complaint: right hip pain  HPI Kaitlyn Ramirez is a 78 y.o. female who has severe Right hip pain. Patient has had an ongoing history of right hip discomfort that has persisted for many years. She localizes the pain along the anterior and posterior margins of her hip. She states that her pain is made worse with any prolonged standing or weightbearing. She states that it is very stiff and will sometimes give out on her throughout the day while walking. It greatly limits her ability to ambulate long distances and perform her ADLs if she would like. She has failed conservative treatment including Tylenol, NSAIDs, topical heat, analgesic patches and activity modification. She is not utilizing any ambulatory aids currently. She has requested operative intervention for relief of her DJD symptoms. She does report that she has history of PVCs for which she sees a cardiologist and takes magnesium, but denies any other cardiac history. No pulmonary history. No previous DVTs or clots. Patient is not a diabetic. She denies having any previous surgeries on this right hip or lower extremity.  Social Hx: Patient lives at home alone and is retired. She denies any alcohol or illicit drug use. No nicotine use or smoking.  Medications & Allergies Allergies: Allergies Allergen Reactions Sulfa (Sulfonamide Antibiotics) Other (See Comments) Other Reaction: Other reaction Amoxicillin Diarrhea, Nausea and Vomiting  Home Medicines: Current Outpatient Medications on File Prior to Visit Medication Sig Dispense Refill amLODIPine  (NORVASC ) 5 MG tablet Take 1 tablet (5 mg total) by mouth once daily 90 tablet 3 cholecalciferol (VITAMIN D3) 1000 unit capsule Take 1,000 Units by mouth once daily famotidine (PEPCID) 40  MG tablet Take 1 tablet (40 mg total) by mouth at bedtime 30 tablet 11 Lactobacillus acidophilus (PROBIOTIC ORAL) Take by mouth Culterelle includes pribiotic latanoprost (XALATAN) 0.005 % ophthalmic solution Place 1 drop into both eyes at bedtime 2.5 mL 12 magnesium oxide 250 mg magnesium Tab Take by mouth metoclopramide (REGLAN) 5 MG tablet Take 1 tablet (5 mg total) by mouth at bedtime for 90 days 30 tablet 2 omega-3 fatty acids/fish oil 340-1,000 mg capsule Take 1 capsule by mouth once daily 1400 mg pantoprazole (PROTONIX) 40 MG DR tablet Take 1 tablet (40 mg total) by mouth 2 (two) times daily before meals Takes in afternoon also if needed 180 tablet 3 pravastatin (PRAVACHOL) 10 MG tablet TAKE 1 TABLET(10 MG) BY MOUTH AT BEDTIME 90 tablet 3  No current facility-administered medications on file prior to visit.  Medical / Surgical History  Past Medical History: Diagnosis Date Allergy Anemia Anxiety Arrhythmia Arthritis Asthma, unspecified asthma severity, unspecified whether complicated, unspecified whether persistent (HHS-HCC) Essential hypertension 01/07/2018 GERD (gastroesophageal reflux disease) with stricture, 03/10 dilation Hyperlipidemia 12/03/2013 Hyperthyroidism Infertility management Nuclear sclerotic cataract of both eyes - Both Eyes Reflux esophagitis 12/03/2013 Sinusitis, unspecified Thyroid  disease Urinary incontinence, mixed   Past Surgical History: Procedure Laterality Date IRIDECTOMY PERIPHERAL FOR GLAUCOMA Bilateral 10/05/2012 COLONOSCOPY 10/18/2016 Adenomatous Polyp, FH Colon Polyps (Sister): CBF 10/2021 EXTRACTION CATARACT EXTRACAPSULAR W/INSERTION INTRAOCULAR PROSTHESIS Right 08/03/2021 Procedure: EXTRACTION CATARACT EXTRACAPSULAR W/INSERTION INTRAOCULAR PROSTHESIS; Surgeon: Bethany Pleas, MD; [facility]; Service: Ophthalmology; Laterality: Right; COLONOSCOPY 12/08/2021 Tubular adenoma/Hyperplastic polyp/SSPs/FHx CP/Repeat 40yrs/SMR EGD  12/08/2021 Normal EGD biopsy/No repeat indicated/SMR APPENDECTOMY BREAST SURGERY biopsy right breast COLONOSCOPY 05/02/2011, 02/08/2006 FH Colon Polyps (Sister): CBF 04/2016; Recall Ltr  mailed 02/29/2016 (dw) EGD 09/06/2012, 04/04/2009 No repeat per RTE HYSTERECTOMY total HYSTEROSCOPY OOPHORECTOMY 1981 OTHER SURGERY Cataract surgery right eye Jan 2023 S/P breast biopsies   Physical Exam  Ht:172.7 cm (5' 8) Wt:72.6 kg (160 lb) BMI: Body mass index is 24.33 kg/m.  General/Constitutional: No apparent distress: well-nourished and well developed. Eyes: Pupils equal, round with synchronous movement. Lymphatic: No palpable adenopathy. Respiratory: Patient has good chest rise and fall with inspiration and expiration. All lung fields are clear to auscultation bilaterally. There is no Rales, rhonchi or wheezes appreciated. Cardiovascular: Upon auscultation there is a regular rate and rhythm without any murmurs, rubs, gallops or heaves appreciated. There does not appear to be any swelling down the lower extremities. Posterior tibial pulses appreciated bilaterally, 2+. Integumentary: No impressive skin lesions present, except as noted in detailed exam. Neuro/Psych: Normal mood and affect, oriented to person, place and time. Musculoskeletal: see exam below  Right hip exam Right Hip:  Upon inspection of the patient's right hip, there does not appear to be any noticeable redness, swelling or gross deformity. No open laceration or abrasion appreciated  Pelvic tilt: Negative Limb lengths: Equal with the patient standing Soft tissue swelling: Negative Erythema: Negative Crepitance: Negative Tenderness: Greater trochanter is nontender to palpation. Mild pain is elicited by axial compression or extremes of rotation, notably with internal rotation. Atrophy: No atrophy. Fair to good hip flexor and abductor strength. Range of Motion: EXT/FLEX: 10/95 with good strength ADD/ABD: -/30 IR/ER:  Difficulty getting past neutral positioning with internal rotation/30  Patient is neurovascularly intact to all dermatomes extending down there Right lower extremity to all dermatomes. Posterior tibial pulses were appreciated, 2+.  Imaging Hip Imaging: None ordered today. Previous images from 01/04/2024 were reviewed. Upon inspection, there is noticeable breakdown along the femoral acetabular margins along the central and inferior portions. Subchondral sclerosis is appreciated. Osteophyte formation is present. Initial signs of wear along the femoral head appreciated. No fractures, lytic lesions or gross deformities appreciated on films.  Assesment and Plan Degenerative arthrosis of the right hip  I have recommended that [facility] undergo right total hip replacement. Consents has been signed. The risks, benefits, prognosis and alternatives including but not limited to DVT, PE, infection, neurovascular injury, failure of the procedure and death were explained to the patient and she is willing to proceed with surgery as described to her by myself. Plan will be for post operative admission of at least 1 midnight for pain control and PT. She will be managed with DVT prophylaxis, antibiotics preoperatively for 24 hours and aggressive in patient rehab.  Pre, intra and post op interventions were discussed. Patient has good understanding  Medication Reconciliation was performed. Discussed cessation of vitamins and supplements.  A total of 45 minutes was spent reviewing patient's charts, medical reconciliation, discussing/educating the patient about surgical interventions, and answering any questions provided by the patient.  JOSHUA DALLAS KOYANAGI, PA [facility] 02/06/2024 Electronically signed by KOYANAGI Fonda DALLAS, PA at 02/06/2024 1:04 PM EDT

## 2024-02-10 NOTE — Discharge Instructions (Signed)
 Instructions after Total Hip Replacement   Kaitlyn Ramirez P. Angie Fava., M.D.    Dept. of Orthopaedics & Sports Medicine [facility] [address] [place], [place]  [address]  Phone: [phone]   Fax: [phone]        [email or web address]        DIET: Drink plenty of non-alcoholic fluids. Resume your normal diet. Include foods high in fiber.  ACTIVITY:  You may use crutches or a walker with weight-bearing as tolerated, unless instructed otherwise. You may be weaned off of the walker or crutches by your Physical Therapist.  Do NOT reach below the level of your knees or cross your legs until allowed.    Continue doing gentle exercises. Exercising will reduce the pain and swelling, increase motion, and prevent muscle weakness.   Please continue to use the TED compression stockings for 6 weeks. You may remove the stockings at night, but should reapply them in the morning. Do not drive or operate any equipment until instructed.  WOUND CARE:  Continue to use ice packs periodically to reduce pain and swelling. The initial dressing (Aquacel) can remain in place for 7 days (see separate instructions). Keep the incision clean and dry. You may bathe or shower after the staples are removed at the first office visit following surgery.  MEDICATIONS: You may resume your regular medications. Please take the pain medication as prescribed on the medication. Do not take pain medication on an empty stomach. Unless instructed otherwise, you should take an enteric-coated aspirin 81 mg. TWICE a day. (This along with elevation will help reduce the possibility of blood clots/phlebitis in your operated leg.) Use a stool softener (such as Senokot-S or Colace) daily and a laxative (such as Miralax or Dulcolax) as needed to prevent constipation.  Do not drive or drink alcoholic beverages when taking pain medications.  CALL THE OFFICE FOR: Temperature above 101 degrees Excessive bleeding or drainage  on the dressing. Excessive swelling, coldness, or paleness of the toes. Persistent nausea and vomiting.  FOLLOW-UP:  You should have an appointment to return to the office in 6 weeks after surgery. Arrangements have been made for continuation of Physical Therapy (either home therapy or outpatient therapy).     [facility] Department Directory         [email or web address]       [email or web address]          Cardiology  Appointments: [place] - [phone]  Endocrinology  Appointments: [place] [phone] [place] - [phone]  Gastroenterology  Appointments: [place] [phone] [place] - [phone]        General Surgery   Appointments: [facility]  Internal Medicine/Family Medicine  Appointments: [facility] [place] - [phone] [place] - [phone]  Metabolic and Weigh Loss Surgery  Appointments: [facility]        Neurology  Appointments: [place] [phone] [place] - [phone]  Neurosurgery  Appointments: [place]  Obstetrics & Gynecology  Appointments: [place] [phone] [place] - [phone]        Pediatrics  Appointments: [facility] [phone] [place] - [phone]  Physiatry  Appointments: [place] [phone]  Physical Therapy  Appointments: [place] - [phone]        Podiatry  Appointments: [place] [phone] [place] - [phone]  Pulmonology  Appointments: [place]  Rheumatology  Appointments: [place] [phone]        [place] Location: [facility]  [address] [place], [place]  [address]  [place]  Location: [facility]. [address] [place], [place]  [address]  [place] Location: [facility] [address] [place], [place]  [address]

## 2024-02-11 ENCOUNTER — Other Ambulatory Visit: Payer: Self-pay

## 2024-02-11 ENCOUNTER — Encounter
Admission: RE | Admit: 2024-02-11 | Discharge: 2024-02-11 | Disposition: A | Discharge status: Home / Self Care | Source: Ambulatory Visit | Attending: Orthopedic Surgery | Admitting: Orthopedic Surgery

## 2024-02-11 VITALS — BP 130/58 | HR 57 | Temp 98.0°F | Resp 18 | Ht 68.0 in | Wt 160.0 lb

## 2024-02-11 DIAGNOSIS — Z01818 Encounter for other preprocedural examination: Secondary | ICD-10-CM | POA: Diagnosis not present

## 2024-02-11 DIAGNOSIS — M1611 Unilateral primary osteoarthritis, right hip: Secondary | ICD-10-CM | POA: Diagnosis not present

## 2024-02-11 DIAGNOSIS — Z01812 Encounter for preprocedural laboratory examination: Secondary | ICD-10-CM

## 2024-02-11 DIAGNOSIS — Z880 Allergy status to penicillin: Secondary | ICD-10-CM | POA: Diagnosis not present

## 2024-02-11 HISTORY — DX: Other specified cardiac arrhythmias: I49.8

## 2024-02-11 HISTORY — DX: Mixed hyperlipidemia: E78.2

## 2024-02-11 HISTORY — DX: Urge incontinence: N39.41

## 2024-02-11 HISTORY — DX: Other forms of angina pectoris: I20.89

## 2024-02-11 HISTORY — DX: Palpitations: R00.2

## 2024-02-11 HISTORY — DX: Ventricular premature depolarization: I49.3

## 2024-02-11 HISTORY — DX: Essential (primary) hypertension: I10

## 2024-02-11 HISTORY — DX: Frequency of micturition: R35.0

## 2024-02-11 HISTORY — DX: Gastro-esophageal reflux disease without esophagitis: K21.9

## 2024-02-11 HISTORY — DX: Atrial premature depolarization: I49.1

## 2024-02-11 HISTORY — DX: Supraventricular tachycardia, unspecified: I47.10

## 2024-02-11 LAB — TYPE AND SCREEN
ABO/RH(D): A POS
Antibody Screen: NEGATIVE

## 2024-02-11 LAB — URINALYSIS, ROUTINE W REFLEX MICROSCOPIC
Bilirubin Urine: NEGATIVE
Glucose, UA: NEGATIVE mg/dL
Hgb urine dipstick: NEGATIVE
Ketones, ur: NEGATIVE mg/dL
Leukocytes,Ua: NEGATIVE
Nitrite: NEGATIVE
Protein, ur: NEGATIVE mg/dL
Specific Gravity, Urine: 1.015 (ref 1.005–1.030)
pH: 7 (ref 5.0–8.0)

## 2024-02-11 LAB — COMPREHENSIVE METABOLIC PANEL WITH GFR
ALT: 13 U/L (ref 0–44)
AST: 21 U/L (ref 15–41)
Albumin: 3.7 g/dL (ref 3.5–5.0)
Alkaline Phosphatase: 50 U/L (ref 38–126)
Anion gap: 12 (ref 5–15)
BUN: 17 mg/dL (ref 8–23)
CO2: 25 mmol/L (ref 22–32)
Calcium: 9.2 mg/dL (ref 8.9–10.3)
Chloride: 106 mmol/L (ref 98–111)
Creatinine, Ser: 0.93 mg/dL (ref 0.44–1.00)
GFR, Estimated: >60 mL/min (ref >60)
Glucose, Bld: 121 mg/dL — ABNORMAL HIGH (ref 70–99)
Potassium: 3.5 mmol/L (ref 3.5–5.1)
Sodium: 143 mmol/L (ref 135–145)
Total Bilirubin: 0.4 mg/dL (ref 0.0–1.2)
Total Protein: 6.8 g/dL (ref 6.5–8.1)

## 2024-02-11 LAB — CBC
HCT: 35.3 % — ABNORMAL LOW (ref 36.0–46.0)
Hemoglobin: 11.8 g/dL — ABNORMAL LOW (ref 12.0–15.0)
MCH: 28.5 pg (ref 26.0–34.0)
MCHC: 33.4 g/dL (ref 30.0–36.0)
MCV: 85.3 fL (ref 80.0–100.0)
Platelets: 236 K/uL (ref 150–400)
RBC: 4.14 MIL/uL (ref 3.87–5.11)
RDW: 12.4 % (ref 11.5–15.5)
WBC: 4.1 K/uL (ref 4.0–10.5)
nRBC: 0 % (ref 0.0–0.2)

## 2024-02-11 LAB — C-REACTIVE PROTEIN: CRP: 0.5 mg/dL (ref <1.0)

## 2024-02-11 LAB — SEDIMENTATION RATE: Sed Rate: 45 mm/h — ABNORMAL HIGH (ref 0–30)

## 2024-02-11 LAB — SURGICAL PCR SCREEN
MRSA, PCR: NEGATIVE
Staphylococcus aureus: NEGATIVE

## 2024-02-11 NOTE — Patient Instructions (Addendum)
 Your procedure is scheduled on: Monday 02/18/24 Report to the Registration Desk on the 1st floor of the [facility]. To find out your arrival time, please call [phone] between 1PM - 3PM on: Friday 02/15/24 If your arrival time is 6:00 am, do not arrive before that time as the [facility] entrance doors do not open until 6:00 am.  REMEMBER: Instructions that are not followed completely may result in serious medical risk, up to and including death; or upon the discretion of your surgeon and anesthesiologist your surgery may need to be rescheduled.  Do not eat food after midnight the night before surgery.  No gum chewing or hard candies.  You may however, drink CLEAR liquids up to 2 hours before you are scheduled to arrive for your surgery. Do not drink anything within 2 hours of your scheduled arrival time.  Clear liquids include: - water  - apple juice without pulp - gatorade (not RED colors) - black coffee or tea (Do NOT add milk or creamers to the coffee or tea) Do NOT drink anything that is not on this list.   In addition, your doctor has ordered for you to drink the provided:  Ensure Pre-Surgery Clear Carbohydrate Drink  Drinking this carbohydrate drink up to two hours before surgery helps to reduce insulin resistance and improve patient outcomes. Please complete drinking 2 hours before scheduled arrival time.  One week prior to surgery: Stop Anti-inflammatories (NSAIDS) such as Advil, Aleve, Ibuprofen, Motrin, Naproxen, Naprosyn and Aspirin based products such as Excedrin, Goody's Powder, BC Powder. Stop ANY OVER THE COUNTER supplements until after surgery. b complex vitamins  Calcium Carb-Cholecalciferol (CALCIUM + VITAMIN D3 PO)  ferrous sulfate  Lactobacillus  fish oil-omega  Magnesium Oxide 250 MG  Vitamin D,   You may however, continue to take Tylenol if needed for pain up until the day of surgery.  Continue taking all of your other prescription medications up  until the day of surgery.  ON THE DAY OF SURGERY ONLY TAKE THESE MEDICATIONS WITH SIPS OF WATER:  pantoprazole (PROTONIX) 40 MG    No Alcohol for 24 hours before or after surgery.  No Smoking including e-cigarettes for 24 hours before surgery.  No chewable tobacco products for at least 6 hours before surgery.  No nicotine patches on the day of surgery.  Do not use any recreational drugs for at least a week (preferably 2 weeks) before your surgery.  Please be advised that the combination of cocaine and anesthesia may have negative outcomes, up to and including death. If you test positive for cocaine, your surgery will be cancelled.  On the morning of surgery brush your teeth with toothpaste and water, you may rinse your mouth with mouthwash if you wish. Do not swallow any toothpaste or mouthwash.  Use CHG Soap or wipes as directed on instruction sheet.  Do not wear jewelry, make-up, hairpins, clips or nail polish.  For welded (permanent) jewelry: bracelets, anklets, waist bands, etc.  Please have this removed prior to surgery.  If it is not removed, there is a chance that hospital personnel will need to cut it off on the day of surgery.  Do not wear lotions, powders, or perfumes.   Do not shave body hair from the neck down 48 hours before surgery.  Contact lenses, hearing aids and dentures may not be worn into surgery.  Do not bring valuables to the hospital. [facility] is not responsible for any missing/lost belongings or valuables.  Notify your doctor if there is any change in your medical condition (cold, fever, infection).  Wear comfortable clothing (specific to your surgery type) to the hospital.  After surgery, you can help prevent lung complications by doing breathing exercises.  Take deep breaths and cough every 1-2 hours. Your doctor may order a device called an Incentive Spirometer to help you take deep breaths. When coughing or sneezing, hold a pillow firmly  against your incision with both hands. This is called "splinting." Doing this helps protect your incision. It also decreases belly discomfort.  If you are being admitted to the hospital overnight, leave your suitcase in the car. After surgery it may be brought to your room.  In case of increased patient census, it may be necessary for you, the patient, to continue your postoperative care in the Same Day Surgery department.  If you are being discharged the day of surgery, you will not be allowed to drive home. You will need a responsible individual to drive you home and stay with you for 24 hours after surgery.   If you are taking public transportation, you will need to have a responsible individual with you.  Please call the Pre-admissions Testing Dept. at [phone] if you have any questions about these instructions.  Surgery Visitation Policy:  Patients having surgery or a procedure may have two visitors.  Children under the age of 22 must have an adult with them who is not the patient.  Inpatient Visitation:    Visiting hours are 7 a.m. to 8 p.m. Up to four visitors are allowed at one time in a patient room. The visitors may rotate out with other people during the day.  One visitor age 34 or older may stay with the patient overnight and must be in the room by 8 p.m.   Merchandiser, retail to address health-related social needs:  [email or web address]     Pre-operative 5 CHG Bath Instructions   You can play a key role in reducing the risk of infection after surgery. Your skin needs to be as free of germs as possible. You can reduce the number of germs on your skin by washing with CHG (chlorhexidine gluconate) soap before surgery. CHG is an antiseptic soap that kills germs and continues to kill germs even after washing.   DO NOT use if you have an allergy to chlorhexidine/CHG or antibacterial soaps. If your skin becomes reddened or irritated, stop using the  CHG and notify one of our RNs at [phone].   Please shower with the CHG soap starting 4 days before surgery using the following schedule:     Please keep in mind the following:  DO NOT shave, including legs and underarms, starting the day of your first shower.   You may shave your face at any point before/day of surgery.  Place clean sheets on your bed the day you start using CHG soap. Use a clean washcloth (not used since being washed) for each shower. DO NOT sleep with pets once you start using the CHG.   CHG Shower Instructions:  If you choose to wash your hair and private area, wash first with your normal shampoo/soap.  After you use shampoo/soap, rinse your hair and body thoroughly to remove shampoo/soap residue.  Turn the water OFF and apply about 3 tablespoons (45 ml) of CHG soap to a CLEAN washcloth.  Apply CHG soap ONLY FROM YOUR NECK DOWN TO YOUR TOES (washing for 3-5 minutes)  DO NOT use CHG soap  on face, private areas, open wounds, or sores.  Pay special attention to the area where your surgery is being performed.  If you are having back surgery, having someone wash your back for you may be helpful. Wait 2 minutes after CHG soap is applied, then you may rinse off the CHG soap.  Pat dry with a clean towel  Put on clean clothes/pajamas   If you choose to wear lotion, please use ONLY the CHG-compatible lotions on the back of this paper.     Additional instructions for the day of surgery: DO NOT APPLY any lotions, deodorants, cologne, or perfumes.   Put on clean/comfortable clothes.  Brush your teeth.  Ask your nurse before applying any prescription medications to the skin.      CHG Compatible Lotions   Aveeno Moisturizing lotion  Cetaphil Moisturizing Cream  Cetaphil Moisturizing Lotion  Clairol Herbal Essence Moisturizing Lotion, Dry Skin  Clairol Herbal Essence Moisturizing Lotion, Extra Dry Skin  Clairol Herbal Essence Moisturizing Lotion, Normal Skin   Curel Age Defying Therapeutic Moisturizing Lotion with Alpha Hydroxy  Curel Extreme Care Body Lotion  Curel Soothing Hands Moisturizing Hand Lotion  Curel Therapeutic Moisturizing Cream, Fragrance-Free  Curel Therapeutic Moisturizing Lotion, Fragrance-Free  Curel Therapeutic Moisturizing Lotion, Original Formula  Eucerin Daily Replenishing Lotion  Eucerin Dry Skin Therapy Plus Alpha Hydroxy Crme  Eucerin Dry Skin Therapy Plus Alpha Hydroxy Lotion  Eucerin Original Crme  Eucerin Original Lotion  Eucerin Plus Crme Eucerin Plus Lotion  Eucerin TriLipid Replenishing Lotion  Keri Anti-Bacterial Hand Lotion  Keri Deep Conditioning Original Lotion Dry Skin Formula Softly Scented  Keri Deep Conditioning Original Lotion, Fragrance Free Sensitive Skin Formula  Keri Lotion Fast Absorbing Fragrance Free Sensitive Skin Formula  Keri Lotion Fast Absorbing Softly Scented Dry Skin Formula  Keri Original Lotion  Keri Skin Renewal Lotion Keri Silky Smooth Lotion  Keri Silky Smooth Sensitive Skin Lotion  Nivea Body Creamy Conditioning Oil  Nivea Body Extra Enriched Lotion  Nivea Body Original Lotion  Nivea Body Sheer Moisturizing Lotion Nivea Crme  Nivea Skin Firming Lotion  NutraDerm 30 Skin Lotion  NutraDerm Skin Lotion  NutraDerm Therapeutic Skin Cream  NutraDerm Therapeutic Skin Lotion  ProShield Protective Hand Cream  Provon moisturizing lotion  How to Use an Incentive Spirometer  An incentive spirometer is a tool that measures how well you are filling your lungs with each breath. Learning to take long, deep breaths using this tool can help you keep your lungs clear and active. This may help to reverse or lessen your chance of developing breathing (pulmonary) problems, especially infection. You may be asked to use a spirometer: After a surgery. If you have a lung problem or a history of smoking. After a long period of time when you have been unable to move or be active. If the spirometer  includes an indicator to show the highest number that you have reached, your health care provider or respiratory therapist will help you set a goal. Keep a log of your progress as told by your health care provider. What are the risks? Breathing too quickly may cause dizziness or cause you to pass out. Take your time so you do not get dizzy or light-headed. If you are in pain, you may need to take pain medicine before doing incentive spirometry. It is harder to take a deep breath if you are having pain. How to use your incentive spirometer  Sit up on the edge of your bed or on a chair.  Hold the incentive spirometer so that it is in an upright position. Before you use the spirometer, breathe out normally. Place the mouthpiece in your mouth. Make sure your lips are closed tightly around it. Breathe in slowly and as deeply as you can through your mouth, causing the piston or the ball to rise toward the top of the chamber. Hold your breath for 3-5 seconds, or for as long as possible. If the spirometer includes a coach indicator, use this to guide you in breathing. Slow down your breathing if the indicator goes above the marked areas. Remove the mouthpiece from your mouth and breathe out normally. The piston or ball will return to the bottom of the chamber. Rest for a few seconds, then repeat the steps 10 or more times. Take your time and take a few normal breaths between deep breaths so that you do not get dizzy or light-headed. Do this every 1-2 hours when you are awake. If the spirometer includes a goal marker to show the highest number you have reached (best effort), use this as a goal to work toward during each repetition. After each set of 10 deep breaths, cough a few times. This will help to make sure that your lungs are clear. If you have an incision on your chest or abdomen from surgery, place a pillow or a rolled-up towel firmly against the incision when you cough. This can help to reduce pain  while taking deep breaths and coughing. General tips When you are able to get out of bed: Walk around often. Continue to take deep breaths and cough in order to clear your lungs. Keep using the incentive spirometer until your health care provider says it is okay to stop using it. If you have been in the hospital, you may be told to keep using the spirometer at home. Contact a health care provider if: You are having difficulty using the spirometer. You have trouble using the spirometer as often as instructed. Your pain medicine is not giving enough relief for you to use the spirometer as told. You have a fever. Get help right away if: You develop shortness of breath. You develop a cough with bloody mucus from the lungs. You have fluid or blood coming from an incision site after you cough. Summary An incentive spirometer is a tool that can help you learn to take long, deep breaths to keep your lungs clear and active. You may be asked to use a spirometer after a surgery, if you have a lung problem or a history of smoking, or if you have been inactive for a long period of time. Use your incentive spirometer as instructed every 1-2 hours while you are awake. If you have an incision on your chest or abdomen, place a pillow or a rolled-up towel firmly against your incision when you cough. This will help to reduce pain. Get help right away if you have shortness of breath, you cough up bloody mucus, or blood comes from your incision when you cough. This information is not intended to replace advice given to you by your health care provider. Make sure you discuss any questions you have with your health care provider. Document Revised: 09/22/2019 Document Reviewed: 09/22/2019   Please go to the following website to access important education materials concerning your upcoming joint replacement.  [email or web address]

## 2024-02-13 LAB — IGE: IgE (Immunoglobulin E), Serum: 16 [IU]/mL (ref 6–495)

## 2024-02-17 ENCOUNTER — Encounter: Payer: Self-pay | Admitting: Orthopedic Surgery

## 2024-02-17 MED ORDER — ORAL CARE MOUTH RINSE: 15.0000 mL | Freq: Once | OROMUCOSAL | Status: AC

## 2024-02-17 MED ORDER — CEFAZOLIN SODIUM-DEXTROSE 2-4 GM/100ML-% IV SOLN
2.0000 g | INTRAVENOUS | Status: AC
  Administered 2024-02-18: 2 g via INTRAVENOUS

## 2024-02-17 MED ORDER — CHLORHEXIDINE GLUCONATE 0.12 % MT SOLN
15.0000 mL | Freq: Once | OROMUCOSAL | Status: AC
  Administered 2024-02-18: 15 mL via OROMUCOSAL

## 2024-02-17 MED ORDER — DEXAMETHASONE SODIUM PHOSPHATE 10 MG/ML IJ SOLN
8.0000 mg | Freq: Once | INTRAMUSCULAR | Status: AC
  Administered 2024-02-18: 8 mg via INTRAVENOUS

## 2024-02-17 MED ORDER — TRANEXAMIC ACID-NACL 1000-0.7 MG/100ML-% IV SOLN
1000.0000 mg | INTRAVENOUS | Status: AC
  Administered 2024-02-18: 1000 mg via INTRAVENOUS

## 2024-02-17 MED ORDER — GABAPENTIN 300 MG PO CAPS
300.0000 mg | ORAL_CAPSULE | Freq: Once | ORAL | Status: AC
  Administered 2024-02-18: 300 mg via ORAL

## 2024-02-17 MED ORDER — LACTATED RINGERS IV SOLN: INTRAVENOUS | Status: DC

## 2024-02-17 MED ORDER — CELECOXIB 200 MG PO CAPS
400.0000 mg | ORAL_CAPSULE | Freq: Once | ORAL | Status: AC
  Administered 2024-02-18: 400 mg via ORAL

## 2024-02-17 MED ORDER — CHLORHEXIDINE GLUCONATE 4 % EX SOLN: 60.0000 mL | Freq: Once | CUTANEOUS | Status: DC

## 2024-02-18 ENCOUNTER — Observation Stay

## 2024-02-18 ENCOUNTER — Other Ambulatory Visit: Payer: Self-pay

## 2024-02-18 ENCOUNTER — Encounter
Admission: RE | Disposition: A | Discharge status: Home / Self Care | Payer: Self-pay | Source: Ambulatory Visit | Attending: Orthopedic Surgery

## 2024-02-18 ENCOUNTER — Observation Stay
Admission: RE | Admit: 2024-02-18 | Discharge: 2024-02-19 | Disposition: A | Discharge status: Home / Self Care | Source: Ambulatory Visit | Attending: Orthopedic Surgery | Admitting: Orthopedic Surgery

## 2024-02-18 ENCOUNTER — Ambulatory Visit: Admitting: Certified Registered Nurse Anesthetist

## 2024-02-18 ENCOUNTER — Ambulatory Visit: Payer: Self-pay | Admitting: Urgent Care

## 2024-02-18 ENCOUNTER — Encounter: Payer: Self-pay | Admitting: Orthopedic Surgery

## 2024-02-18 DIAGNOSIS — Z471 Aftercare following joint replacement surgery: Secondary | ICD-10-CM | POA: Diagnosis not present

## 2024-02-18 DIAGNOSIS — I10 Essential (primary) hypertension: Secondary | ICD-10-CM | POA: Insufficient documentation

## 2024-02-18 DIAGNOSIS — Z7982 Long term (current) use of aspirin: Secondary | ICD-10-CM | POA: Diagnosis not present

## 2024-02-18 DIAGNOSIS — M8580 Other specified disorders of bone density and structure, unspecified site: Secondary | ICD-10-CM | POA: Diagnosis not present

## 2024-02-18 DIAGNOSIS — Z79899 Other long term (current) drug therapy: Secondary | ICD-10-CM | POA: Diagnosis not present

## 2024-02-18 DIAGNOSIS — M1611 Unilateral primary osteoarthritis, right hip: Secondary | ICD-10-CM | POA: Diagnosis not present

## 2024-02-18 DIAGNOSIS — J45909 Unspecified asthma, uncomplicated: Secondary | ICD-10-CM | POA: Insufficient documentation

## 2024-02-18 DIAGNOSIS — M25551 Pain in right hip: Secondary | ICD-10-CM | POA: Diagnosis present

## 2024-02-18 DIAGNOSIS — Z96641 Presence of right artificial hip joint: Secondary | ICD-10-CM | POA: Diagnosis not present

## 2024-02-18 DIAGNOSIS — Z880 Allergy status to penicillin: Secondary | ICD-10-CM

## 2024-02-18 HISTORY — PX: TOTAL HIP ARTHROPLASTY: SHX124

## 2024-02-18 LAB — ABO/RH: ABO/RH(D): A POS

## 2024-02-18 SURGERY — ARTHROPLASTY, HIP, TOTAL,POSTERIOR APPROACH
Anesthesia: Spinal | Site: Hip | Laterality: Right

## 2024-02-18 MED ORDER — CHLORHEXIDINE GLUCONATE 0.12 % MT SOLN
OROMUCOSAL | Status: AC
Start: 2024-02-18 — End: 2024-02-18
  Filled 2024-02-18: qty 15

## 2024-02-18 MED ORDER — TRANEXAMIC ACID-NACL 1000-0.7 MG/100ML-% IV SOLN
INTRAVENOUS | Status: AC
  Filled 2024-02-18: qty 100

## 2024-02-18 MED ORDER — METOCLOPRAMIDE HCL 10 MG PO TABS
10.0000 mg | ORAL_TABLET | Freq: Three times a day (TID) | ORAL | Status: DC
  Administered 2024-02-18 – 2024-02-19 (×2): 10 mg via ORAL
  Filled 2024-02-18 (×2): qty 1

## 2024-02-18 MED ORDER — OXYCODONE HCL 5 MG PO TABS
ORAL_TABLET | ORAL | Status: AC
  Filled 2024-02-18: qty 1

## 2024-02-18 MED ORDER — MENTHOL 3 MG MT LOZG: 1.0000 | LOZENGE | OROMUCOSAL | Status: DC | PRN

## 2024-02-18 MED ORDER — PANTOPRAZOLE SODIUM 40 MG PO TBEC
40.0000 mg | DELAYED_RELEASE_TABLET | Freq: Two times a day (BID) | ORAL | Status: DC
  Administered 2024-02-18 – 2024-02-19 (×2): 40 mg via ORAL
  Filled 2024-02-18 (×3): qty 1

## 2024-02-18 MED ORDER — OXYCODONE HCL 5 MG/5ML PO SOLN: 5.0000 mg | Freq: Once | ORAL | Status: DC | PRN

## 2024-02-18 MED ORDER — TRANEXAMIC ACID-NACL 1000-0.7 MG/100ML-% IV SOLN
1000.0000 mg | Freq: Once | INTRAVENOUS | Status: AC
  Administered 2024-02-18: 1000 mg via INTRAVENOUS

## 2024-02-18 MED ORDER — ENSURE PRE-SURGERY PO LIQD
296.0000 mL | Freq: Once | ORAL | Status: DC
  Administered 2024-02-18: 296 mL via ORAL
  Filled 2024-02-18: qty 296

## 2024-02-18 MED ORDER — SENNOSIDES-DOCUSATE SODIUM 8.6-50 MG PO TABS
1.0000 | ORAL_TABLET | Freq: Two times a day (BID) | ORAL | Status: DC
  Administered 2024-02-18 – 2024-02-19 (×2): 1 via ORAL
  Filled 2024-02-18 (×2): qty 1

## 2024-02-18 MED ORDER — MAGNESIUM OXIDE 400 MG PO TABS
200.0000 mg | ORAL_TABLET | Freq: Every day | ORAL | Status: DC
  Administered 2024-02-19: 200 mg via ORAL
  Filled 2024-02-18: qty 1
  Filled 2024-02-18: qty 0.5

## 2024-02-18 MED ORDER — OXYCODONE HCL 5 MG PO TABS: 5.0000 mg | ORAL_TABLET | Freq: Once | ORAL | Status: DC | PRN

## 2024-02-18 MED ORDER — FERROUS SULFATE 325 (65 FE) MG PO TABS
325.0000 mg | ORAL_TABLET | Freq: Two times a day (BID) | ORAL | Status: DC
  Administered 2024-02-19: 325 mg via ORAL
  Filled 2024-02-18: qty 1

## 2024-02-18 MED ORDER — PHENOL 1.4 % MT LIQD: 1.0000 | OROMUCOSAL | Status: DC | PRN

## 2024-02-18 MED ORDER — BUPIVACAINE HCL (PF) 0.5 % IJ SOLN
INTRAMUSCULAR | Status: AC
  Filled 2024-02-18: qty 10

## 2024-02-18 MED ORDER — OXYCODONE HCL 5 MG PO TABS: 10.0000 mg | ORAL_TABLET | ORAL | Status: DC | PRN

## 2024-02-18 MED ORDER — ASPIRIN 81 MG PO CHEW
81.0000 mg | CHEWABLE_TABLET | Freq: Two times a day (BID) | ORAL | Status: DC
  Administered 2024-02-18 – 2024-02-19 (×3): 81 mg via ORAL
  Filled 2024-02-18 (×3): qty 1

## 2024-02-18 MED ORDER — OXYCODONE HCL 5 MG PO TABS
5.0000 mg | ORAL_TABLET | ORAL | Status: DC | PRN
  Administered 2024-02-18 (×2): 5 mg via ORAL
  Filled 2024-02-18: qty 1

## 2024-02-18 MED ORDER — ONDANSETRON HCL 4 MG/2ML IJ SOLN: 4.0000 mg | Freq: Four times a day (QID) | INTRAMUSCULAR | Status: DC | PRN

## 2024-02-18 MED ORDER — ACETAMINOPHEN 10 MG/ML IV SOLN
INTRAVENOUS | Status: DC | PRN
  Administered 2024-02-18: 1000 mg via INTRAVENOUS

## 2024-02-18 MED ORDER — ONDANSETRON HCL 4 MG PO TABS: 4.0000 mg | ORAL_TABLET | Freq: Four times a day (QID) | ORAL | Status: DC | PRN

## 2024-02-18 MED ORDER — FENTANYL CITRATE (PF) 100 MCG/2ML IJ SOLN
25.0000 ug | INTRAMUSCULAR | Status: DC | PRN
  Administered 2024-02-18: 50 ug via INTRAVENOUS
  Administered 2024-02-18 (×2): 25 ug via INTRAVENOUS

## 2024-02-18 MED ORDER — FENTANYL CITRATE (PF) 100 MCG/2ML IJ SOLN
INTRAMUSCULAR | Status: AC
Start: 2024-02-18 — End: 2024-02-18
  Filled 2024-02-18: qty 2

## 2024-02-18 MED ORDER — CEFAZOLIN SODIUM-DEXTROSE 2-4 GM/100ML-% IV SOLN
INTRAVENOUS | Status: AC
Start: 2024-02-18 — End: 2024-02-18
  Filled 2024-02-18: qty 100

## 2024-02-18 MED ORDER — MAGNESIUM HYDROXIDE 400 MG/5ML PO SUSP
30.0000 mL | Freq: Every day | ORAL | Status: DC
  Administered 2024-02-19: 30 mL via ORAL
  Filled 2024-02-18: qty 30

## 2024-02-18 MED ORDER — CELECOXIB 200 MG PO CAPS
200.0000 mg | ORAL_CAPSULE | Freq: Two times a day (BID) | ORAL | Status: DC
  Administered 2024-02-18 – 2024-02-19 (×2): 200 mg via ORAL
  Filled 2024-02-18 (×2): qty 1

## 2024-02-18 MED ORDER — SURGIPHOR WOUND IRRIGATION SYSTEM - OPTIME: TOPICAL | Status: DC | PRN

## 2024-02-18 MED ORDER — SODIUM CHLORIDE 0.9 % IR SOLN
Status: DC | PRN
  Administered 2024-02-18: 3000 mL

## 2024-02-18 MED ORDER — 0.9 % SODIUM CHLORIDE (POUR BTL) OPTIME
TOPICAL | Status: DC | PRN
  Administered 2024-02-18: 500 mL

## 2024-02-18 MED ORDER — PROPOFOL 1000 MG/100ML IV EMUL
INTRAVENOUS | Status: AC
  Filled 2024-02-18: qty 100

## 2024-02-18 MED ORDER — ACETAMINOPHEN 10 MG/ML IV SOLN
INTRAVENOUS | Status: AC
  Filled 2024-02-18: qty 100

## 2024-02-18 MED ORDER — GLYCOPYRROLATE 0.2 MG/ML IJ SOLN
INTRAMUSCULAR | Status: AC
  Filled 2024-02-18: qty 1

## 2024-02-18 MED ORDER — DEXAMETHASONE SODIUM PHOSPHATE 10 MG/ML IJ SOLN
INTRAMUSCULAR | Status: AC
  Filled 2024-02-18: qty 1

## 2024-02-18 MED ORDER — BISACODYL 10 MG RE SUPP: 10.0000 mg | Freq: Every day | RECTAL | Status: DC | PRN

## 2024-02-18 MED ORDER — ACETAMINOPHEN 325 MG PO TABS: 325.0000 mg | ORAL_TABLET | Freq: Four times a day (QID) | ORAL | Status: DC | PRN

## 2024-02-18 MED ORDER — GABAPENTIN 300 MG PO CAPS
ORAL_CAPSULE | ORAL | Status: AC
  Filled 2024-02-18: qty 1

## 2024-02-18 MED ORDER — DIPHENHYDRAMINE HCL 12.5 MG/5ML PO ELIX: 12.5000 mg | ORAL_SOLUTION | ORAL | Status: DC | PRN

## 2024-02-18 MED ORDER — HYDROMORPHONE HCL 1 MG/ML IJ SOLN: 0.5000 mg | INTRAMUSCULAR | Status: DC | PRN

## 2024-02-18 MED ORDER — BUPIVACAINE HCL (PF) 0.5 % IJ SOLN
INTRAMUSCULAR | Status: DC | PRN
  Administered 2024-02-18: 3 mL

## 2024-02-18 MED ORDER — ALUM & MAG HYDROXIDE-SIMETH 200-200-20 MG/5ML PO SUSP
30.0000 mL | ORAL | Status: DC | PRN
Start: 2024-02-18 — End: 2024-02-19

## 2024-02-18 MED ORDER — ACETAMINOPHEN 10 MG/ML IV SOLN
1000.0000 mg | Freq: Four times a day (QID) | INTRAVENOUS | Status: DC
  Administered 2024-02-18 – 2024-02-19 (×3): 1000 mg via INTRAVENOUS
  Filled 2024-02-18 (×4): qty 100

## 2024-02-18 MED ORDER — CELECOXIB 200 MG PO CAPS
ORAL_CAPSULE | ORAL | Status: AC
  Filled 2024-02-18: qty 2

## 2024-02-18 MED ORDER — EPHEDRINE SULFATE-NACL 50-0.9 MG/10ML-% IV SOSY
PREFILLED_SYRINGE | INTRAVENOUS | Status: DC | PRN
  Administered 2024-02-18: 5 mg via INTRAVENOUS

## 2024-02-18 MED ORDER — PHENYLEPHRINE HCL-NACL 20-0.9 MG/250ML-% IV SOLN
INTRAVENOUS | Status: DC | PRN
  Administered 2024-02-18: 25 ug/min via INTRAVENOUS

## 2024-02-18 MED ORDER — MIDAZOLAM HCL 2 MG/2ML IJ SOLN
INTRAMUSCULAR | Status: AC
Start: 2024-02-18 — End: 2024-02-18
  Filled 2024-02-18: qty 2

## 2024-02-18 MED ORDER — CEFAZOLIN SODIUM-DEXTROSE 2-4 GM/100ML-% IV SOLN
2.0000 g | Freq: Four times a day (QID) | INTRAVENOUS | Status: AC
  Administered 2024-02-18 (×2): 2 g via INTRAVENOUS
  Filled 2024-02-18 (×2): qty 100

## 2024-02-18 MED ORDER — GLYCOPYRROLATE 0.2 MG/ML IJ SOLN
INTRAMUSCULAR | Status: DC | PRN
Start: 2024-02-18 — End: 2024-02-18
  Administered 2024-02-18: .2 mg via INTRAVENOUS

## 2024-02-18 MED ORDER — TRAMADOL HCL 50 MG PO TABS
50.0000 mg | ORAL_TABLET | ORAL | Status: DC | PRN
  Administered 2024-02-18 (×2): 50 mg via ORAL
  Filled 2024-02-18 (×2): qty 1

## 2024-02-18 MED ORDER — AMLODIPINE BESYLATE 10 MG PO TABS
5.0000 mg | ORAL_TABLET | Freq: Every day | ORAL | Status: DC
  Administered 2024-02-18: 5 mg via ORAL
  Filled 2024-02-18 (×2): qty 1

## 2024-02-18 MED ORDER — FENTANYL CITRATE (PF) 100 MCG/2ML IJ SOLN
INTRAMUSCULAR | Status: DC | PRN
  Administered 2024-02-18 (×2): 50 ug via INTRAVENOUS

## 2024-02-18 MED ORDER — PHENYLEPHRINE HCL-NACL 20-0.9 MG/250ML-% IV SOLN
INTRAVENOUS | Status: AC
  Filled 2024-02-18: qty 250

## 2024-02-18 MED ORDER — LATANOPROST 0.005 % OP SOLN
1.0000 [drp] | Freq: Every day | OPHTHALMIC | Status: DC
  Administered 2024-02-18: 1 [drp] via OPHTHALMIC
  Filled 2024-02-18 (×2): qty 2.5

## 2024-02-18 MED ORDER — FENTANYL CITRATE (PF) 100 MCG/2ML IJ SOLN
INTRAMUSCULAR | Status: AC
  Filled 2024-02-18: qty 2

## 2024-02-18 MED ORDER — FLUTICASONE PROPIONATE 50 MCG/ACT NA SUSP: 2.0000 | Freq: Every day | NASAL | Status: DC | PRN

## 2024-02-18 MED ORDER — PRAVASTATIN SODIUM 10 MG PO TABS
10.0000 mg | ORAL_TABLET | Freq: Every evening | ORAL | Status: DC
  Administered 2024-02-18: 10 mg via ORAL
  Filled 2024-02-18 (×2): qty 1

## 2024-02-18 MED ORDER — MIDAZOLAM HCL 5 MG/5ML IJ SOLN
INTRAMUSCULAR | Status: DC | PRN
  Administered 2024-02-18: 1 mg via INTRAVENOUS
  Administered 2024-02-18 (×2): .5 mg via INTRAVENOUS

## 2024-02-18 MED ORDER — EPHEDRINE 5 MG/ML INJ
INTRAVENOUS | Status: AC
  Filled 2024-02-18: qty 5

## 2024-02-18 MED ORDER — PROPOFOL 500 MG/50ML IV EMUL
INTRAVENOUS | Status: DC | PRN
  Administered 2024-02-18: 50 ug/kg/min via INTRAVENOUS

## 2024-02-18 MED ORDER — SODIUM CHLORIDE 0.9 % IV SOLN: INTRAVENOUS | Status: DC

## 2024-02-18 MED ORDER — FLEET ENEMA RE ENEM: 1.0000 | ENEMA | Freq: Once | RECTAL | Status: DC | PRN

## 2024-02-18 SURGICAL SUPPLY — 47 items
BLADE CLIPPER SURG (BLADE) IMPLANT
BLADE SAW 90X25X1.19 OSCILLAT (BLADE) ×1 IMPLANT
BRUSH SCRUB EZ PLAIN DRY (MISCELLANEOUS) ×1 IMPLANT
CUP ACETBLR 54 OD 100 SERIES (Hips) IMPLANT
DRAPE INCISE IOBAN 66X60 STRL (DRAPES) ×1 IMPLANT
DRAPE SHEET LG 3/4 BI-LAMINATE (DRAPES) ×1 IMPLANT
DRSG AQUACEL AG ADV 3.5X14 (GAUZE/BANDAGES/DRESSINGS) ×1 IMPLANT
DRSG MEPILEX SACRM 8.7X9.8 (GAUZE/BANDAGES/DRESSINGS) ×1 IMPLANT
DRSG TEGADERM 4X4.75 (GAUZE/BANDAGES/DRESSINGS) ×1 IMPLANT
DURAPREP 26ML APPLICATOR (WOUND CARE) ×2 IMPLANT
ELECT CAUTERY BLADE 6.4 (BLADE) ×1 IMPLANT
ELECTRODE REM PT RTRN 9FT ADLT (ELECTROSURGICAL) ×1 IMPLANT
EVACUATOR 1/8 PVC DRAIN (DRAIN) ×1 IMPLANT
GAUZE XEROFORM 1X8 LF (GAUZE/BANDAGES/DRESSINGS) ×1 IMPLANT
GLOVE BIOGEL M STRL SZ7.5 (GLOVE) ×6 IMPLANT
GLOVE BIOGEL PI IND STRL 8 (GLOVE) ×1 IMPLANT
GLOVE SRG 8 PF TXTR STRL LF DI (GLOVE) ×1 IMPLANT
GOWN STRL REUS W/ TWL LRG LVL3 (GOWN DISPOSABLE) ×2 IMPLANT
GOWN STRL REUS W/ TWL XL LVL3 (GOWN DISPOSABLE) ×1 IMPLANT
GOWN TOGA ZIPPER T7+ PEEL AWAY (MISCELLANEOUS) ×1 IMPLANT
HANDLE YANKAUER SUCT OPEN TIP (MISCELLANEOUS) ×1 IMPLANT
HEAD M SROM 36MM PLUS 1.5 (Hips) IMPLANT
HOLDER FOLEY CATH W/STRAP (MISCELLANEOUS) ×1 IMPLANT
HOOD PEEL AWAY T7 (MISCELLANEOUS) ×1 IMPLANT
KIT PEG BOARD PINK (KITS) ×1 IMPLANT
KIT TURNOVER KIT A (KITS) ×1 IMPLANT
LINER MARATHON NEUT +4X54X36 (Hips) IMPLANT
MANIFOLD NEPTUNE II (INSTRUMENTS) ×2 IMPLANT
NS IRRIG 500ML POUR BTL (IV SOLUTION) ×1 IMPLANT
PACK HIP PROSTHESIS (MISCELLANEOUS) ×1 IMPLANT
PENCIL SMOKE EVACUATOR COATED (MISCELLANEOUS) ×1 IMPLANT
PIN STEIN THRED 5/32 (Pin) ×1 IMPLANT
SOL .9 NS 3000ML IRR UROMATIC (IV SOLUTION) ×1 IMPLANT
SOLUTION IRRIG SURGIPHOR (IV SOLUTION) ×1 IMPLANT
SPONGE DRAIN TRACH 4X4 STRL 2S (GAUZE/BANDAGES/DRESSINGS) ×1 IMPLANT
STAPLER SKIN PROX 35W (STAPLE) ×1 IMPLANT
STEM FEMORAL SZ 6MM STD ACTIS (Stem) IMPLANT
SUT ETHIBOND #5 BRAIDED 30INL (SUTURE) ×1 IMPLANT
SUT VIC AB 0 CT1 36 (SUTURE) ×2 IMPLANT
SUT VIC AB 1 CT1 36 (SUTURE) ×2 IMPLANT
SUT VIC AB 2-0 CT1 TAPERPNT 27 (SUTURE) ×1 IMPLANT
TAPE CLOTH 3X10 WHT NS LF (GAUZE/BANDAGES/DRESSINGS) ×1 IMPLANT
TIP FAN IRRIG PULSAVAC PLUS (DISPOSABLE) ×1 IMPLANT
TOWEL OR 17X26 4PK STRL BLUE (TOWEL DISPOSABLE) IMPLANT
TRAP FLUID SMOKE EVACUATOR (MISCELLANEOUS) ×1 IMPLANT
TRAY FOLEY MTR SLVR 16FR STAT (SET/KITS/TRAYS/PACK) ×1 IMPLANT
WATER STERILE IRR 1000ML POUR (IV SOLUTION) ×1 IMPLANT

## 2024-02-18 NOTE — Anesthesia Procedure Notes (Addendum)
 Date/Time: 02/18/2024 7:43 AM  Performed by: Duwayne Craven, CRNAPre-anesthesia Checklist: Patient identified, Emergency Drugs available, Suction available, Patient being monitored and Timeout performed Patient Re-evaluated:Patient Re-evaluated prior to induction Oxygen Delivery Method: Simple face mask Induction Type: IV induction Placement Confirmation: CO2 detector and positive ETCO2

## 2024-02-18 NOTE — Progress Notes (Signed)
 Subjective: 1 Day Post-Op Procedure(s) (LRB): ARTHROPLASTY, HIP, TOTAL,POSTERIOR APPROACH (Right) Patient reports pain as mild.   Patient seen in rounds with Dr. Mardee. Patient is well, and has had no acute complaints or problems Denies any CP, SOB, N/V, fevers or chills We will start therapy today.  Plan is to go Home after hospital stay.  Objective: Vital signs in last 24 hours: Temp:  [96.9 F (36.1 C)-98.7 F (37.1 C)] 97.9 F (36.6 C) (08/05 0415) Pulse Rate:  [47-70] 68 (08/05 0415) Resp:  [14-25] 16 (08/05 0415) BP: (93-136)/(34-65) 135/59 (08/05 0415) SpO2:  [97 %-100 %] 100 % (08/05 0415)  Intake/Output from previous day:  Intake/Output Summary (Last 24 hours) at 02/19/2024 0756 Last data filed at 02/19/2024 9372 Gross per 24 hour  Intake 2692.55 ml  Output 630 ml  Net 2062.55 ml    Intake/Output this shift: No intake/output data recorded.  Labs: No results for input(s): HGB in the last 72 hours. No results for input(s): WBC, RBC, HCT, PLT in the last 72 hours. No results for input(s): NA, K, CL, CO2, BUN, CREATININE, GLUCOSE, CALCIUM in the last 72 hours. No results for input(s): LABPT, INR in the last 72 hours.  EXAM General - Patient is Alert, Appropriate, and Oriented Extremity - Neurologically intact Neurovascular intact Sensation intact distally Intact pulses distally Dorsiflexion/Plantar flexion intact No cellulitis present Compartment soft Dressing - dressing C/D/I and no drainage Motor Function - intact, moving foot and toes well on exam. JP Drain pulled without difficulty. Intact  Past Medical History:  Diagnosis Date   Anemia    Angina at rest [facility])    Arthritis    Dyspnea    Dysrhythmia 2015   heart palpatations, irregular rhythm   Essential hypertension    Fluttering heart    Frequency of micturition    Gastroesophageal reflux disease    GERD (gastroesophageal reflux disease)    Heart palpitations     Hypercholesteremia    Hyperlipidemia    Irritable bowel    Malaise and fatigue    Mixed hyperlipidemia    Nonsustained paroxysmal supraventricular tachycardia (HCC)    Obesity    PAC (premature atrial contraction)    Palpitations    Phlebitis    PVC's (premature ventricular contractions)    Sinusitis    Urge incontinence     Assessment/Plan: 1 Day Post-Op Procedure(s) (LRB): ARTHROPLASTY, HIP, TOTAL,POSTERIOR APPROACH (Right) Principal Problem:   Hx of total hip arthroplasty, right  Estimated body mass index is 24.33 kg/m as calculated from the following:   Height as of this encounter: 5' 8 (1.727 m).   Weight as of this encounter: 72.6 kg. Advance diet Up with therapy  Patient will continue to work with physical therapy to pass postoperative PT protocols, ROM and strengthening  Hip Preacutions  Discussed with the patient continuing to utilize ice over the bandage  Patient will wear TED hose bilaterally to help prevent DVT and clot formation  Discussed the Aquacel bandage.  This bandage will stay in place 7 days postoperatively.  Can be replaced with honeycomb bandages that will be sent home with the patient  Discussed sending the patient home with tramadol  and oxycodone  for as needed pain management.  Patient will also be sent home with Celebrex  to help with swelling and inflammation.  Patient will take an 81 mg aspirin  twice daily for DVT prophylaxis  JP drain removed without difficulty, intact  Weight-Bearing as tolerated to right leg  Patient will follow-up with [facility] in 6 weeks for re-imaging and reevaluation   Fonda Koyanagi, PA-C [facility] 02/19/2024, 7:56 AM

## 2024-02-18 NOTE — Discharge Summary (Signed)
 Physician Discharge Summary  Subjective: 1 Day Post-Op Procedure(s) (LRB): ARTHROPLASTY, HIP, TOTAL,POSTERIOR APPROACH (Right) Patient reports pain as mild.   Patient seen in rounds with Dr. Mardee. Patient is well, and has had no acute complaints or problems Denies any CP, SOB, N/V, fevers or chills We will start therapy today.  Patient is ready to go home  Physician Discharge Summary  Patient ID: Kaitlyn Ramirez MRN: [id number] DOB/AGE: [birth year] 76 y.o.  Admit date: 02/18/2024 Discharge date: 02/19/2024  Admission Diagnoses:  Discharge Diagnoses:  Principal Problem:   Hx of total hip arthroplasty, right   Discharged Condition: good  Hospital Course: Patient presented to the hospital on 02/18/2024 for an elective right total hip arthroplasty performed by Dr. Mardee. Patient was given 1g of TXA and 2g of Ancef  prior to the procedure. she tolerated the procedure well without any complications. See procedural note below for details. Postoperatively, the patient did very well. she was able to pass PT protocols on post-op day one without any issues. JP drain was removed without any difficulty and was intact. She  was able to void her bladder without any difficulty. Physical exam was unremarkable. she denies any SOB, CP, N/V, fevers or chills. Vital signs are stable. Patient is stable to discharge home.  PROCEDURE:  Right total hip arthroplasty   SURGEON:  Lynwood SHAUNNA Mardee Mickey. M.D.   ASSISTANT:  Sidra Koyanagi, PA-C (present and scrubbed throughout the case, critical for assistance with exposure, retraction, instrumentation, and closure)   ANESTHESIA: spinal   ESTIMATED BLOOD LOSS: 100 mL   FLUIDS REPLACED: 1100 mL of crystalloid   DRAINS: 2 medium hemovac   IMPLANTS UTILIZED: DePuy size 6 standard offset Actis femoral stem, 54 mm OD Pinnacle 100 acetabular component, +4 mm neutral Pinnacle Marathon polyethylene insert, and a 36 mm M-SPEC +1.5 mm hip ball  Treatments:  None  Discharge Exam: Blood pressure (!) 135/59, pulse 68, temperature 97.9 F (36.6 C), temperature source Oral, resp. rate 16, height 5' 8 (1.727 m), weight 72.6 kg, SpO2 100%.   Disposition: home   Allergies as of 02/19/2024       Reactions   Amoxicillin Nausea And Vomiting   IgE = 16 (WNL) on 02/11/2024   Sulfa Antibiotics         Medication List     TAKE these medications    acetaminophen  500 MG tablet Commonly known as: TYLENOL  Take 500 mg by mouth every 6 (six) hours as needed.   amLODipine  5 MG tablet Commonly known as: NORVASC  Take 5 mg by mouth daily.   aspirin  81 MG chewable tablet Chew 1 tablet (81 mg total) by mouth 2 (two) times daily.   b complex vitamins capsule Take 1 capsule by mouth daily.   CALCIUM + VITAMIN D3 PO Take 1 tablet by mouth daily.   celecoxib  200 MG capsule Commonly known as: CELEBREX  Take 1 capsule (200 mg total) by mouth 2 (two) times daily.   CULTURELLE ADVANCED REGULARITY PO Take by mouth.   ferrous sulfate  325 (65 FE) MG tablet Take 325 mg by mouth every other day.   fish oil-omega-3 fatty acids 1000 MG capsule Take 1 g by mouth in the morning and at bedtime.   fluticasone  50 MCG/ACT nasal spray Commonly known as: FLONASE  Place 2 sprays into both nostrils daily as needed for allergies.   latanoprost  0.005 % ophthalmic solution Commonly known as: XALATAN  Place 1 drop into both eyes at bedtime.   lidocaine  4 % cream Commonly  known as: LMX Apply 1 Application topically as needed.   Magnesium  Oxide 250 MG Tabs Take 250 mg by mouth daily.   oxyCODONE  5 MG immediate release tablet Commonly known as: Oxy IR/ROXICODONE  Take 1 tablet (5 mg total) by mouth every 4 (four) hours as needed for moderate pain (pain score 4-6) (pain score 4-6).   pantoprazole  40 MG tablet Commonly known as: PROTONIX  Take 40 mg by mouth 2 (two) times daily before a meal.   pravastatin  10 MG tablet Commonly known as: PRAVACHOL  Take 10  mg by mouth every evening.   TEARS NATURALE FORTE OP Apply 1 drop to eye as needed (for dry eyes).   traMADol  50 MG tablet Commonly known as: ULTRAM  Take 1-2 tablets (50-100 mg total) by mouth every 4 (four) hours as needed for moderate pain (pain score 4-6).   Vitamin D (Cholecalciferol) 25 MCG (1000 UT) Tabs Take 1,000 Units by mouth daily.               Durable Medical Equipment  (From admission, onward)           Start     Ordered   02/18/24 1007  DME Walker rolling  Once       Question:  Patient needs a walker to treat with the following condition  Answer:  S/P total hip arthroplasty   02/18/24 1006   02/18/24 1007  DME Bedside commode  Once       Comments: Patient is not able to walk the distance required to go the bathroom, or he/she is unable to safely negotiate stairs required to access the bathroom.  A 3in1 BSC will alleviate this problem  Question:  Patient needs a bedside commode to treat with the following condition  Answer:  S/P total hip arthroplasty   02/18/24 1006            Follow-up Information     Hooten, Lynwood SQUIBB, MD Follow up on 04/01/2024.   Specialty: Orthopedic Surgery Why: at 2:30pm Contact information: [address] [facility] [place] [address] [phone]                 Signed: Sidra Koyanagi 02/19/2024, 8:03 AM   Objective: Vital signs in last 24 hours: Temp:  [96.9 F (36.1 C)-98.7 F (37.1 C)] 97.9 F (36.6 C) (08/05 0415) Pulse Rate:  [47-70] 68 (08/05 0415) Resp:  [14-25] 16 (08/05 0415) BP: (93-136)/(34-65) 135/59 (08/05 0415) SpO2:  [97 %-100 %] 100 % (08/05 0415)  Intake/Output from previous day:  Intake/Output Summary (Last 24 hours) at 02/19/2024 0803 Last data filed at 02/19/2024 9372 Gross per 24 hour  Intake 2692.55 ml  Output 630 ml  Net 2062.55 ml    Intake/Output this shift: No intake/output data recorded.  Labs: No results for input(s): HGB in the last 72 hours. No  results for input(s): WBC, RBC, HCT, PLT in the last 72 hours. No results for input(s): NA, K, CL, CO2, BUN, CREATININE, GLUCOSE, CALCIUM in the last 72 hours. No results for input(s): LABPT, INR in the last 72 hours.  EXAM: General - Patient is Alert, Appropriate, and Oriented Extremity - Neurologically intact Neurovascular intact Sensation intact distally Intact pulses distally Dorsiflexion/Plantar flexion intact No cellulitis present Compartment soft Dressing - dressing C/D/I and no drainage Motor Function - intact, moving foot and toes well on exam. JP Drain pulled without difficulty. Intact  Assessment/Plan: 1 Day Post-Op Procedure(s) (LRB): ARTHROPLASTY, HIP, TOTAL,POSTERIOR APPROACH (Right) Procedure(s) (LRB): ARTHROPLASTY,  HIP, TOTAL,POSTERIOR APPROACH (Right) Past Medical History:  Diagnosis Date   Anemia    Angina at rest [facility])    Arthritis    Dyspnea    Dysrhythmia 2015   heart palpatations, irregular rhythm   Essential hypertension    Fluttering heart    Frequency of micturition    Gastroesophageal reflux disease    GERD (gastroesophageal reflux disease)    Heart palpitations    Hypercholesteremia    Hyperlipidemia    Irritable bowel    Malaise and fatigue    Mixed hyperlipidemia    Nonsustained paroxysmal supraventricular tachycardia (HCC)    Obesity    PAC (premature atrial contraction)    Palpitations    Phlebitis    PVC's (premature ventricular contractions)    Sinusitis    Urge incontinence    Principal Problem:   Hx of total hip arthroplasty, right  Estimated body mass index is 24.33 kg/m as calculated from the following:   Height as of this encounter: 5' 8 (1.727 m).   Weight as of this encounter: 72.6 kg.  Patient will continue to work with physical therapy to pass postoperative PT protocols, ROM and strengthening   Hip Preacutions   Discussed with the patient continuing to utilize ice over the bandage    Patient will wear TED hose bilaterally to help prevent DVT and clot formation   Discussed the Aquacel bandage.  This bandage will stay in place 7 days postoperatively.  Can be replaced with honeycomb bandages that will be sent home with the patient   Discussed sending the patient home with tramadol  and oxycodone  for as needed pain management.  Patient will also be sent home with Celebrex  to help with swelling and inflammation.  Patient will take an 81 mg aspirin  twice daily for DVT prophylaxis   JP drain removed without difficulty, intact   Weight-Bearing as tolerated to right leg   Patient will follow-up with [facility] orthopedics in 6 weeks for re-imaging and reevaluation  Diet - Regular diet Follow up - in 2 weeks Activity - WBAT Disposition - Home Condition Upon Discharge - Good DVT Prophylaxis - Aspirin  and TED hose  Fonda CHARLENA Koyanagi, PA-C Orthopaedic Surgery 02/19/2024, 8:03 AM

## 2024-02-18 NOTE — Anesthesia Procedure Notes (Addendum)
 Spinal  Patient location during procedure: OR Start time: 02/18/2024 7:24 AM End time: 02/18/2024 7:28 AM Reason for block: surgical anesthesia Staffing Performed: resident/CRNA  Resident/CRNA: Duwayne Craven, CRNA Performed by: Duwayne Craven, CRNA Authorized by: Leavy Ned, MD   Preanesthetic Checklist Completed: patient identified, IV checked, site marked, risks and benefits discussed, surgical consent, monitors and equipment checked, pre-op evaluation and timeout performed Spinal Block Patient position: sitting Prep: ChloraPrep Patient monitoring: heart rate, continuous pulse ox, blood pressure and cardiac monitor Approach: midline Location: L4-5 Injection technique: single-shot Needle Needle type: Whitacre and Introducer  Needle gauge: 25 G Needle length: 9 cm Assessment Sensory level: T10 Events: CSF return Additional Notes Sterile aseptic technique used throughout the procedure.  Negative paresthesia. Negative blood return. Positive free-flowing CSF. Expiration date of kit checked and confirmed. Patient tolerated procedure well, without complications.

## 2024-02-18 NOTE — Progress Notes (Signed)
 Patient is not able to walk the distance required to go the bathroom, or he/she is unable to safely negotiate stairs required to access the bathroom.  A 3in1 BSC will alleviate this problem   Amenda Duclos P. Angie Fava M.D.

## 2024-02-18 NOTE — Op Note (Signed)
 OPERATIVE NOTE  DATE OF SURGERY:  02/18/2024  PATIENT NAME:  Kaitlyn Ramirez   DOB: 03-Jul-1946  MRN: [id number]  PRE-OPERATIVE DIAGNOSIS: Degenerative arthrosis of the right hip, primary  POST-OPERATIVE DIAGNOSIS:  Same  PROCEDURE:  Right total hip arthroplasty  SURGEON:  Lynwood SHAUNNA Mardee Mickey. M.D.  ASSISTANT:  Sidra Koyanagi, PA-C (present and scrubbed throughout the case, critical for assistance with exposure, retraction, instrumentation, and closure)  ANESTHESIA: spinal  ESTIMATED BLOOD LOSS: 100 mL  FLUIDS REPLACED: 1100 mL of crystalloid  DRAINS: 2 medium hemovac  IMPLANTS UTILIZED: DePuy size 6 standard offset Actis femoral stem, 54 mm OD Pinnacle 100 acetabular component, +4 mm neutral Pinnacle Marathon polyethylene insert, and a 36 mm M-SPEC +1.5 mm hip ball  INDICATIONS FOR SURGERY: Kaitlyn Ramirez is a 78 y.o. year old female with a long history of progressive hip and groin  pain. X-rays demonstrated severe degenerative changes. The patient had not seen any significant improvement despite conservative nonsurgical intervention. After discussion of the risks and benefits of surgical intervention, the patient expressed understanding of the risks benefits and agree with plans for total hip arthroplasty.   The risks, benefits, and alternatives were discussed at length including but not limited to the risks of infection, bleeding, nerve injury, stiffness, blood clots, the need for revision surgery, limb length inequality, dislocation, cardiopulmonary complications, among others, and they were willing to proceed.  PROCEDURE IN DETAIL: The patient was brought into the operating room and, after adequate spinal anesthesia was achieved, the patient was placed in a left lateral decubitus position. Axillary roll was placed and all bony prominences were well-padded. The patient's right hip was cleaned and prepped with alcohol and DuraPrep and draped in the usual sterile fashion. A timeout was  performed as per usual protocol. A lateral curvilinear incision was made gently curving towards the posterior superior iliac spine. The IT band was incised in line with the skin incision and the fibers of the gluteus maximus were split in line. The piriformis tendon was identified, skeletonized, and incised at its insertion to the proximal femur and reflected posteriorly. A T type posterior capsulotomy was performed. Prior to dislocation of the femoral head, a threaded Steinmann pin was inserted through a separate stab incision into the pelvis superior to the acetabulum and bent in the form of a stylus so as to assess limb length and hip offset throughout the procedure. The femoral head was then dislocated posteriorly. Inspection of the femoral head demonstrated severe degenerative changes with full-thickness loss of articular cartilage. The femoral neck cut was performed using an oscillating saw. The anterior capsule was elevated off of the femoral neck using a periosteal elevator. Attention was then directed to the acetabulum. The remnant of the labrum was excised using electrocautery. Inspection of the acetabulum also demonstrated significant degenerative changes. The acetabulum was reamed in sequential fashion up to a 53 mm diameter. Good punctate bleeding bone was encountered. A 54 mm Pinnacle 100 acetabular component was positioned and impacted into place. Good scratch fit was appreciated. A +4 mm neutra; polyethylene trial was inserted.  Attention was then directed to the proximal femur.  Femoral broaches were inserted in a sequential fashion up to a size 6 broach. Calcar region was planed and a trial reduction was performed using a standard offset neck and a 36 mm hip ball with a +1.5 mm neck length. Good equalization of limb lengths and hip offset was appreciated and excellent stability was noted both anteriorly and posteriorly.  Trial components were removed. The acetabular shell was irrigated with  copious amounts of normal saline with antibiotic solution and suctioned dry. A +4 mm neutral Pinnacle Marathon polyethylene insert was positioned and impacted into place. Next, a size 6 standardoffset Actis femoral stem was positioned and impacted into place. Excellent scratch fit was appreciated. A trial reduction was again performed with a 36 mm hip ball with a +1.5 mm neck length. Again, good equalization of limb lengths was appreciated and excellent stability appreciated both anteriorly and posteriorly. The hip was then dislocated and the trial hip ball was removed. The Morse taper was cleaned and dried. A 36 mm M-SPEC hip ball with a +1.5 mm neck length was placed on the trunnion and impacted into place. The hip was then reduced and placed through range of motion. Excellent stability was appreciated both anteriorly and posteriorly.  The wound was irrigated with copious amounts of normal saline followed by 450 ml of Surgiphor and suctioned dry. Good hemostasis was appreciated. The posterior capsulotomy was repaired using #5 Ethibond. Piriformis tendon was reapproximated to the undersurface of the gluteus medius tendon using #5 Ethibond. The IT band was reapproximated using interrupted sutures of #1 Vicryl. Subcutaneous tissue was approximated using first #0 Vicryl followed by #2-0 Vicryl. The skin was closed with skin staples.  The patient tolerated the procedure well and was transported to the recovery room in stable condition.   Lynwood SHAUNNA Mardee Mickey., M.D.

## 2024-02-18 NOTE — Evaluation (Signed)
 Physical Therapy Evaluation Patient Details Name: Kaitlyn Ramirez MRN: [id number] DOB: [birth year] Today's Date: 02/18/2024  History of Present Illness  Pt is a 78yo F admitted to [facility] on 02/18/24 for elective R THA (posterior approach) by Dr. Mardee. Significant PMH includes: HTN, HLD, GERD, PVC's, polyneuropathy, OA, urge incontinence.  Clinical Impression  Pt received in supine with family at bedside and is agreeable for PT eval. At baseline, pt is IND with ADL's, IADL's, ambulation without AD, driving, and medication management.   Pt presents with expected deficits after R THA including: increased pain levels, decreased balance, hypotension, RLE weakness, impaired sensation, decreased awareness of precautions, and decreased activity tolerance, resulting in impaired functional mobility from baseline. Due to deficits, pt required supervision for bed mobility, CGA for transfers, and CGA to ambulate short distance in room. Further mobility deferred this date secondary to symptomatic hypotension, RN aware (see below).   Deficits limit the pt's ability to safely and independently perform ADL's, transfer, and ambulate. Pt will benefit from acute skilled PT services to address deficits for return to baseline function. Pt will benefit from post acute therapy services to address deficits for return to baseline function.  Seated EOB: 105/65 mmHg Seated EOB (after BLE therex): 100/55 mmHg Seated in recliner: 88/50 mmHg Seated in recliner (2'): 95/50 mmHg Seated in recliner (5'): 98/47 mmHg  Pt left seated in recliner, back reclined, legs elevated, SCD's donned. Family at bedside.         If plan is discharge home, recommend the following: A little help with walking and/or transfers;A little help with bathing/dressing/bathroom;Assistance with cooking/housework;Assist for transportation;Help with stairs or ramp for entrance   Can travel by private vehicle    Yes    Equipment Recommendations Rolling  walker (2 wheels);BSC/3in1     Functional Status Assessment Patient has had a recent decline in their functional status and demonstrates the ability to make significant improvements in function in a reasonable and predictable amount of time.     Precautions / Restrictions Precautions Precautions: Posterior Hip;Fall Precaution Booklet Issued: Yes (comment) Restrictions Weight Bearing Restrictions Per Provider Order: Yes RLE Weight Bearing Per Provider Order: Weight bearing as tolerated      Mobility  Bed Mobility               General bed mobility comments: supervision for safety to sit EOB, increased time/effort, multimodal cues for post hip precautions    Transfers Overall transfer level: Needs assistance   Transfers: Sit to/from Stand Sit to Stand: Contact guard assist           General transfer comment: CGA for safety for STS transfers at EOB and recliner with RW. Multimodal cues for safety, sequencing, post hip precautions, and hand placement.    Ambulation/Gait Ambulation/Gait assistance: Contact guard assist Gait Distance (Feet): 2 Feet Assistive device: Rolling walker (2 wheels)         General Gait Details: Step by step cues for limited amb from EOB>recliner with RW at CGA level    Balance Overall balance assessment: Needs assistance Sitting-balance support: Bilateral upper extremity supported, Feet supported Sitting balance-Leahy Scale: Fair     Standing balance support: Bilateral upper extremity supported, During functional activity, Reliant on assistive device for balance Standing balance-Leahy Scale: Fair Standing balance comment: standing balance with RW                             Pertinent Vitals/Pain Pain Assessment  Pain Assessment: 0-10 Pain Score: 3  Pain Location: R hip Pain Descriptors / Indicators: Aching Pain Intervention(s): Limited activity within patient's tolerance, Monitored during session, Premedicated before  session, Repositioned    Home Living Family/patient expects to be discharged to:: Private residence Living Arrangements: Alone Available Help at Discharge: Family;Available 24 hours/day (24/7 care for the next week) Type of Home: House Home Access: Stairs to enter Entrance Stairs-Rails: Can reach both Entrance Stairs-Number of Steps: garage entrance: 2 Alternate Level Stairs-Number of Steps: flight of stairs to 2nd level Home Layout: Two level;1/2 bath on main level;Bed/bath upstairs (could sleep on recliner downstairs) Home Equipment: Rolling Walker (2 wheels);Shower seat;Cane - single point;BSC/3in1 Additional Comments: sleeps in a semi-electric bed    Prior Function Prior Level of Function : Driving;Independent/Modified Independent             Mobility Comments: Ambulation without AD (limited community ambulation) ADLs Comments: IND ADL's, IADL's (increased difficulty with heavier IADL's requiring increased time/effort), medication management     Extremity/Trunk Assessment   Upper Extremity Assessment Upper Extremity Assessment: Overall WFL for tasks assessed    Lower Extremity Assessment Lower Extremity Assessment: Overall WFL for tasks assessed (N/T in bil feet due to chronic neuropathy.)       Communication   Communication Communication: No apparent difficulties    Cognition Arousal: Alert Behavior During Therapy: WFL for tasks assessed/performed   PT - Cognitive impairments: No apparent impairments                         Following commands: Intact       Cueing Cueing Techniques: Verbal cues, Tactile cues, Visual cues     General Comments General comments (skin integrity, edema, etc.): soft pressures throughout session with c/o mild-mod lightheadedness; lowest reading of 88/50 mmHg, RN aware    Exercises Total Joint Exercises Ankle Circles/Pumps: AROM, Strengthening, Right, 5 reps Gluteal Sets: AROM, Strengthening, Right, 5 reps Towel  Squeeze: AROM, Strengthening, Right, 5 reps Long Arc Quad: AROM, Strengthening, Right, 5 reps Other Exercises Other Exercises: Pt educated re: PT role/POC, DC recommendations, safety with functional mobility, RLE WB precautions, posterior hip precautions and adherence during functional mobility, proper LE positioning, energy conservation/activity pacing, THA handout/HEP.   Assessment/Plan    PT Assessment Patient needs continued PT services  PT Problem List Decreased strength;Decreased range of motion;Decreased activity tolerance;Decreased balance;Decreased mobility;Impaired sensation;Cardiopulmonary status limiting activity;Pain       PT Treatment Interventions DME instruction;Gait training;Stair training;Functional mobility training;Therapeutic activities;Therapeutic exercise;Balance training;Neuromuscular re-education;Patient/family education    PT Goals (Current goals can be found in the Care Plan section)  Acute Rehab PT Goals Patient Stated Goal: go home PT Goal Formulation: With patient/family Time For Goal Achievement: 03/03/24 Potential to Achieve Goals: Good    Frequency BID        AM-PAC PT 6 Clicks Mobility  Outcome Measure Help needed turning from your back to your side while in a flat bed without using bedrails?: A Little Help needed moving from lying on your back to sitting on the side of a flat bed without using bedrails?: A Little Help needed moving to and from a bed to a chair (including a wheelchair)?: A Little Help needed standing up from a chair using your arms (e.g., wheelchair or bedside chair)?: A Little Help needed to walk in hospital room?: A Little Help needed climbing 3-5 steps with a railing? : A Lot 6 Click Score: 17    End of  Session Equipment Utilized During Treatment: Gait belt Activity Tolerance:  (limited by orthostasis) Patient left: in chair;with call bell/phone within reach;with family/visitor present Nurse Communication: Mobility status  (BP) PT Visit Diagnosis: Unsteadiness on feet (R26.81);Muscle weakness (generalized) (M62.81);Difficulty in walking, not elsewhere classified (R26.2);Pain Pain - Right/Left: Right Pain - part of body: Hip    Time: 8555-8463 PT Time Calculation (min) (ACUTE ONLY): 52 min   Charges:   PT Evaluation $PT Eval High Complexity: 1 High PT Treatments $Therapeutic Exercise: 8-22 mins $Therapeutic Activity: 8-22 mins PT General Charges $$ ACUTE PT VISIT: 1 Visit        Camie CHARLENA Kluver, PT, DPT 3:48 PM,02/18/24 Physical Therapist - [facility]

## 2024-02-18 NOTE — Anesthesia Preprocedure Evaluation (Addendum)
 Anesthesia Evaluation  Patient identified by MRN, date of birth, ID band Patient awake    Reviewed: Allergy & Precautions, NPO status , Patient's Chart, lab work & pertinent test results  Airway Mallampati: III  TM Distance: >3 FB Neck ROM: full    Dental  (+) Chipped   Pulmonary neg pulmonary ROS, former smoker   Pulmonary exam normal        Cardiovascular hypertension, Normal cardiovascular exam+ dysrhythmias      Neuro/Psych negative neurological ROS  negative psych ROS   GI/Hepatic Neg liver ROS,GERD  Medicated and Controlled,,  Endo/Other  negative endocrine ROS    Renal/GU      Musculoskeletal   Abdominal   Peds  Hematology negative hematology ROS (+)   Anesthesia Other Findings Past Medical History: No date: Anemia No date: Angina at rest [facility]) No date: Arthritis No date: Dyspnea 2015: Dysrhythmia     Comment:  heart palpatations, irregular rhythm No date: Essential hypertension No date: Fluttering heart No date: Frequency of micturition No date: Gastroesophageal reflux disease No date: GERD (gastroesophageal reflux disease) No date: Heart palpitations No date: Hypercholesteremia No date: Hyperlipidemia No date: Irritable bowel No date: Malaise and fatigue No date: Mixed hyperlipidemia No date: Nonsustained paroxysmal supraventricular tachycardia (HCC) No date: Obesity No date: PAC (premature atrial contraction) No date: Palpitations No date: Phlebitis No date: PVC's (premature ventricular contractions) No date: Sinusitis No date: Urge incontinence  Past Surgical History: 1983: ABDOMINAL HYSTERECTOMY 1983: APPENDECTOMY No date: BREAST SURGERY     Comment:  breast biopsies No date: cataract surgery; Right No date: COLONOSCOPY 10/18/2016: COLONOSCOPY WITH PROPOFOL ; N/A     Comment:  Procedure: COLONOSCOPY WITH PROPOFOL ;  Surgeon: Lamar ONEIDA Holmes, MD;  Location: [facility]  ENDOSCOPY;  Service:               Endoscopy;  Laterality: N/A; 12/08/2021: COLONOSCOPY WITH PROPOFOL ; N/A     Comment:  Procedure: COLONOSCOPY WITH PROPOFOL ;  Surgeon: Onita Elspeth Sharper, DO;  Location: [facility] ENDOSCOPY;  Service:               Gastroenterology;  Laterality: N/A; No date: ESOPHAGOGASTRODUODENOSCOPY 12/08/2021: ESOPHAGOGASTRODUODENOSCOPY (EGD) WITH PROPOFOL ; N/A     Comment:  Procedure: ESOPHAGOGASTRODUODENOSCOPY (EGD) WITH               PROPOFOL ;  Surgeon: Onita Elspeth Sharper, DO;  Location:              [facility] ENDOSCOPY;  Service: Gastroenterology;  Laterality:               N/A; 10/05/2012: EYE SURGERY; Bilateral     Comment:  iridectomy peripheral for glaucoma 09/19/2018: LIPOMA EXCISION     Comment:  Back  BMI    Body Mass Index: 23.97 kg/m      Reproductive/Obstetrics negative OB ROS                              Anesthesia Physical Anesthesia Plan  ASA: 2  Anesthesia Plan: General/Spinal   Post-op Pain Management:    Induction: Intravenous  PONV Risk Score and Plan: 3 and Ondansetron , Dexamethasone , Propofol  infusion, TIVA and Midazolam   Airway Management Planned: Oral ETT  Additional Equipment:   Intra-op Plan:   Post-operative Plan: Extubation in OR  Informed Consent:  I have reviewed the patients History and Physical, chart, labs and discussed the procedure including the risks, benefits and alternatives for the proposed anesthesia with the patient or authorized representative who has indicated his/her understanding and acceptance.     Dental Advisory Given  Plan Discussed with: Anesthesiologist, CRNA and Surgeon  Anesthesia Plan Comments: (Patient reports no bleeding problems and no anticoagulant use.  Plan for spinal with backup GA  Patient consented for risks of anesthesia including but not limited to:  - adverse reactions to medications - damage to eyes, teeth, lips or other oral mucosa -  nerve damage due to positioning  - risk of bleeding, infection and or nerve damage from spinal that could lead to paralysis - risk of headache or failed spinal - damage to teeth, lips or other oral mucosa - sore throat or hoarseness - damage to heart, brain, nerves, lungs, other parts of body or loss of life  Patient voiced understanding and assent.)         Anesthesia Quick Evaluation

## 2024-02-18 NOTE — Interval H&P Note (Signed)
 History and Physical Interval Note:  02/18/2024 6:18 AM  Kaitlyn Ramirez  has presented today for surgery, with the diagnosis of Primary osteoarthritis of right hip.  The various methods of treatment have been discussed with the patient and family. After consideration of risks, benefits and other options for treatment, the patient has consented to  Procedure(s): ARTHROPLASTY, HIP, TOTAL,POSTERIOR APPROACH (Right) as a surgical intervention.  The patient's history has been reviewed, patient examined, no change in status, stable for surgery.  I have reviewed the patient's chart and labs.  Questions were answered to the patient's satisfaction.     Lorenza Winkleman P Jamale Spangler

## 2024-02-18 NOTE — Transfer of Care (Signed)
 Immediate Anesthesia Transfer of Care Note  Patient: Kaitlyn Ramirez  Procedure(s) Performed: ARTHROPLASTY, HIP, TOTAL,POSTERIOR APPROACH (Right: Hip)  Patient Location: PACU  Anesthesia Type:Spinal  Level of Consciousness: drowsy  Airway & Oxygen Therapy: Patient Spontanous Breathing and Patient connected to face mask oxygen  Post-op Assessment: Report given to RN and Post -op Vital signs reviewed and stable  Post vital signs: Reviewed and stable  Last Vitals:  Vitals Value Taken Time  BP 103/55 02/18/24 10:50  Temp    Pulse 57 02/18/24 10:54  Resp 19 02/18/24 10:54  SpO2 100 % 02/18/24 10:54  Vitals shown include unfiled device data.  Last Pain:  Vitals:   02/18/24 0626  PainSc: 2          Complications: No notable events documented.

## 2024-02-19 ENCOUNTER — Encounter: Payer: Self-pay | Admitting: Orthopedic Surgery

## 2024-02-19 DIAGNOSIS — Z79899 Other long term (current) drug therapy: Secondary | ICD-10-CM | POA: Diagnosis not present

## 2024-02-19 DIAGNOSIS — Z7982 Long term (current) use of aspirin: Secondary | ICD-10-CM | POA: Diagnosis not present

## 2024-02-19 DIAGNOSIS — I10 Essential (primary) hypertension: Secondary | ICD-10-CM | POA: Diagnosis not present

## 2024-02-19 DIAGNOSIS — M1611 Unilateral primary osteoarthritis, right hip: Secondary | ICD-10-CM | POA: Diagnosis not present

## 2024-02-19 DIAGNOSIS — J45909 Unspecified asthma, uncomplicated: Secondary | ICD-10-CM | POA: Diagnosis not present

## 2024-02-19 MED ORDER — OXYCODONE HCL 5 MG PO TABS: 5.0000 mg | ORAL_TABLET | ORAL | 0 refills | Status: AC | PRN

## 2024-02-19 MED ORDER — CELECOXIB 200 MG PO CAPS: 200.0000 mg | ORAL_CAPSULE | Freq: Two times a day (BID) | ORAL | 1 refills | Status: AC

## 2024-02-19 MED ORDER — TRAMADOL HCL 50 MG PO TABS: 50.0000 mg | ORAL_TABLET | ORAL | 0 refills | Status: AC | PRN

## 2024-02-19 MED ORDER — ASPIRIN 81 MG PO CHEW: 81.0000 mg | CHEWABLE_TABLET | Freq: Two times a day (BID) | ORAL | Status: AC

## 2024-02-19 NOTE — Progress Notes (Signed)
 Physical Therapy Treatment Patient Details Name: Kaitlyn Ramirez MRN: [id number] DOB: [birth year] Today's Date: 02/19/2024   History of Present Illness Pt is a 77yo F admitted to [facility] on 02/18/24 for elective R THA (posterior approach) by Dr. Mardee. Significant PMH includes: HTN, HLD, GERD, PVC's, polyneuropathy, OA, urge incontinence.    PT Comments  Pt completes gait and stair training.  Generally steady with no LOB or safety concerns.  Orthostatics takes and BP is a bit sot but they are stable today with no dizziness noted.  RN aware.  Discussed expectations, pain meds and safety.  No further acute needs identified.   If plan is discharge home, recommend the following: A little help with walking and/or transfers;A little help with bathing/dressing/bathroom;Assistance with cooking/housework;Assist for transportation;Help with stairs or ramp for entrance   Can travel by private vehicle        Equipment Recommendations  Rolling walker (2 wheels);BSC/3in1    Recommendations for Other Services       Precautions / Restrictions Precautions Precautions: Posterior Hip;Fall Restrictions Weight Bearing Restrictions Per Provider Order: Yes RLE Weight Bearing Per Provider Order: Weight bearing as tolerated     Mobility  Bed Mobility               General bed mobility comments: sitting EOB before and after session Patient Response: Cooperative  Transfers Overall transfer level: Needs assistance Equipment used: Rolling walker (2 wheels) Transfers: Sit to/from Stand Sit to Stand: Supervision                Ambulation/Gait Ambulation/Gait assistance: Supervision Gait Distance (Feet): 200 Feet Assistive device: Rolling walker (2 wheels) Gait Pattern/deviations: Step-through pattern Gait velocity: WFL         Stairs Stairs: Yes Stairs assistance: Supervision Stair Management: One rail Right, Two rails, Forwards Number of Stairs: 4     Wheelchair Mobility      Tilt Bed Tilt Bed Patient Response: Cooperative  Modified Rankin (Stroke Patients Only)       Balance Overall balance assessment: Mild deficits observed, not formally tested                                          Communication Communication Communication: No apparent difficulties  Cognition Arousal: Alert Behavior During Therapy: WFL for tasks assessed/performed   PT - Cognitive impairments: No apparent impairments                         Following commands: Intact      Cueing Cueing Techniques: Verbal cues, Tactile cues, Visual cues  Exercises      General Comments        Pertinent Vitals/Pain Pain Assessment Pain Assessment: Faces Faces Pain Scale: Hurts a little bit Pain Location: R hip Pain Descriptors / Indicators: Sore Pain Intervention(s): Limited activity within patient's tolerance, Monitored during session    Home Living                          Prior Function            PT Goals (current goals can now be found in the care plan section) Progress towards PT goals: Progressing toward goals    Frequency    BID      PT Plan      Co-evaluation  AM-PAC PT 6 Clicks Mobility   Outcome Measure  Help needed turning from your back to your side while in a flat bed without using bedrails?: None Help needed moving from lying on your back to sitting on the side of a flat bed without using bedrails?: None Help needed moving to and from a bed to a chair (including a wheelchair)?: None Help needed standing up from a chair using your arms (e.g., wheelchair or bedside chair)?: None Help needed to walk in hospital room?: A Little Help needed climbing 3-5 steps with a railing? : A Little 6 Click Score: 22    End of Session Equipment Utilized During Treatment: Gait belt Activity Tolerance:  (limited by orthostasis) Patient left: with call bell/phone within reach;in bed Nurse Communication:  Mobility status (BP) PT Visit Diagnosis: Unsteadiness on feet (R26.81);Muscle weakness (generalized) (M62.81);Difficulty in walking, not elsewhere classified (R26.2);Pain Pain - Right/Left: Right Pain - part of body: Hip     Time: 9179-9155 PT Time Calculation (min) (ACUTE ONLY): 24 min  Charges:    $Gait Training: 23-37 mins PT General Charges $$ ACUTE PT VISIT: 1 Visit                   Lauraine Gills, PTA 02/19/24, 8:52 AM

## 2024-02-19 NOTE — Plan of Care (Signed)
 Progressing towards discharge

## 2024-02-19 NOTE — Anesthesia Postprocedure Evaluation (Signed)
 Anesthesia Post Note  Patient: Kaitlyn Ramirez  Procedure(s) Performed: ARTHROPLASTY, HIP, TOTAL,POSTERIOR APPROACH (Right: Hip)  Patient location during evaluation: Nursing Unit Anesthesia Type: Combined General/Spinal Level of consciousness: awake and alert and oriented Pain management: pain level controlled Vital Signs Assessment: post-procedure vital signs reviewed and stable Respiratory status: nonlabored ventilation and respiratory function stable Cardiovascular status: blood pressure returned to baseline Postop Assessment: no headache, no backache, no apparent nausea or vomiting, adequate PO intake and able to ambulate Anesthetic complications: no   No notable events documented.   Last Vitals:  Vitals:   02/18/24 2313 02/19/24 0415  BP: 136/60 (!) 135/59  Pulse: (!) 57 68  Resp: 16 16  Temp: (!) 36.4 C 36.6 C  SpO2: 100% 100%    Last Pain:  Vitals:   02/19/24 0415  TempSrc: Oral  PainSc: 1                  Harsha Yusko D Fredericka Bottcher

## 2024-02-19 NOTE — Evaluation (Signed)
 Occupational Therapy Evaluation Patient Details Name: Kaitlyn Ramirez MRN: [id number] DOB: [birth year] Today's Date: 02/19/2024   History of Present Illness   Pt is a 77yo F admitted to [facility] on 02/18/24 for elective R THA (posterior approach) by Dr. Mardee. Significant PMH includes: HTN, HLD, GERD, PVC's, polyneuropathy, OA, urge incontinence.     Clinical Impressions Pt seen for OT evaluation this date, POD#1 from above surgery. Pt was independent in all ADLs prior to surgery, no reports DME use PTA. Pt is eager to return to PLOF with less pain and improved safety and independence. Pt currently requires minimal assist for LB dressing while in seated position due to pain and limited AROM of R hip. Initially CGA during amb, pt progression to supervision with good attention to safety and body awareness. Pt amb within room to BR, toilet transfer with RW + supervision with few verbal cues for technique, multiple STS from elevated toilet seat during UE/LB dressing. Pt able to recall 2/3 posterior total hip precautions at start of session and unable to verbalize how to implement during ADL and mobility. Pt instructed in posterior total hip precautions and how to implement, self care skills, falls prevention strategies, home/routines modifications, DME/AE for LB bathing and dressing tasks, and compression stocking mgt strategies. Pt ensures she has support at home to be with her at D/C to assist as needed. At end of session, pt able to recall 3/3 posterior total hip precautions. No skilled OT needs identified. Will sign off. Please re-consult if additional needs arise.       If plan is discharge home, recommend the following:   A little help with bathing/dressing/bathroom;Assistance with cooking/housework      Equipment Recommendations   Other (comment) (Reacher/sock aid)      Precautions/Restrictions   Precautions Precautions: Posterior Hip;Fall Precaution Booklet Issued: Yes  (comment) Recall of Precautions/Restrictions: Intact Restrictions Weight Bearing Restrictions Per Provider Order: Yes RLE Weight Bearing Per Provider Order: Weight bearing as tolerated     Mobility Bed Mobility               General bed mobility comments: Seated on the EOB pre/post session    Transfers Overall transfer level: Needs assistance Equipment used: Rolling walker (2 wheels) Transfers: Sit to/from Stand Sit to Stand: Supervision           General transfer comment: Verbal cues for hip precautions adherence, good carryover of technique      Balance Overall balance assessment: Needs assistance Sitting-balance support: Bilateral upper extremity supported, Feet supported Sitting balance-Leahy Scale: Normal Sitting balance - Comments: Steady reaching within BOS during dressing   Standing balance support: Bilateral upper extremity supported, During functional activity, Reliant on assistive device for balance Standing balance-Leahy Scale: Fair Standing balance comment: standing balance with RW                           ADL either performed or assessed with clinical judgement   ADL Overall ADL's : Needs assistance/impaired                 Upper Body Dressing : Sitting;Set up   Lower Body Dressing: Sit to/from stand;Minimal assistance   Toilet Transfer: Supervision/safety;Ambulation;Rolling walker (2 wheels);Cueing for safety;Adhering to hip precautions   Toileting- Clothing Manipulation and Hygiene: Modified independent       Functional mobility during ADLs: Contact guard assist;Supervision/safety;Rolling walker (2 wheels) General ADL Comments: MINA LB dressing post toileting/pericare MODI  Vision Baseline Vision/History: 1 Wears glasses                         Pertinent Vitals/Pain Pain Assessment Pain Assessment: 0-10 Pain Score: 4  Pain Location: R hip Pain Descriptors / Indicators: Sore Pain Intervention(s):  Limited activity within patient's tolerance, Monitored during session     Extremity/Trunk Assessment Upper Extremity Assessment Upper Extremity Assessment: Overall WFL for tasks assessed   Lower Extremity Assessment Lower Extremity Assessment: Defer to PT evaluation   Cervical / Trunk Assessment Cervical / Trunk Assessment: Normal   Communication Communication Communication: No apparent difficulties   Cognition Arousal: Alert Behavior During Therapy: WFL for tasks assessed/performed Cognition: No apparent impairments             OT - Cognition Comments: A/Ox4                 Following commands: Intact       Cueing  General Comments   Cueing Techniques: Verbal cues;Tactile cues;Visual cues  Post operative dressing D/C/I pre/post session   Exercises Exercises: Other exercises Other Exercises Other Exercises: Edu: Role of OT eval, safe ADL completion with adherence to hip precations, transfer technique with DME, AD at d/c         Home Living Family/patient expects to be discharged to:: Private residence Living Arrangements: Alone Available Help at Discharge: Family;Available 24 hours/day Type of Home: House Home Access: Stairs to enter [employer] of Steps: garage entrance: 2 Entrance Stairs-Rails: Can reach both Home Layout: Two level;1/2 bath on main level;Bed/bath upstairs Alternate Level Stairs-Number of Steps: flight of stairs to 2nd level Alternate Level Stairs-Rails: Right Bathroom Shower/Tub: Producer, television/film/video: Standard Bathroom Accessibility: Yes How Accessible: Accessible via walker Home Equipment: Rolling Walker (2 wheels);Shower seat;Cane - single point;BSC/3in1   Additional Comments: sleeps in a semi-electric bed      Prior Functioning/Environment Prior Level of Function : Driving;Independent/Modified Independent             Mobility Comments: Ambulation without AD (limited community ambulation) ADLs  Comments: IND ADL's, IADL's (increased difficulty with heavier IADL's requiring increased time/effort), medication management                 OT Goals(Current goals can be found in the care plan section)   Acute Rehab OT Goals Patient Stated Goal: Return home with less pain OT Goal Formulation: With patient/family Time For Goal Achievement: 03/04/24 Potential to Achieve Goals: Good   AM-PAC OT 6 Clicks Daily Activity     Outcome Measure Help from another person eating meals?: None Help from another person taking care of personal grooming?: None Help from another person toileting, which includes using toliet, bedpan, or urinal?: None Help from another person bathing (including washing, rinsing, drying)?: A Little Help from another person to put on and taking off regular upper body clothing?: None Help from another person to put on and taking off regular lower body clothing?: A Little 6 Click Score: 22   End of Session Equipment Utilized During Treatment: Gait belt;Rolling walker (2 wheels) Nurse Communication: Mobility status  Activity Tolerance: Patient tolerated treatment well Patient left: in bed;with call bell/phone within reach;with family/visitor present                   Time: 9148-9079 OT Time Calculation (min): 29 min Charges:  OT General Charges $OT Visit: 1 Visit OT Evaluation $OT Eval Low Complexity: 1 Low OT  Treatments $Self Care/Home Management : 8-22 mins  Larraine Colas M.S. OTR/L  02/19/24, 11:11 AM

## 2024-02-19 NOTE — Progress Notes (Signed)
 DISCHARGE NOTE:  Pt given discharge instructions, scripts, and 2 honeycomb dressings. TED hose on both legs. BSC and walker sent with the pt. Pt wheeled to car by staff, daughter providing transportation home.

## 2024-02-19 NOTE — Plan of Care (Signed)
  Problem: Elimination: Goal: Will not experience complications related to urinary retention Outcome: Progressing   Problem: Pain Managment: Goal: General experience of comfort will improve and/or be controlled Outcome: Progressing

## 2024-02-19 NOTE — Care Management Obs Status (Signed)
 MEDICARE OBSERVATION STATUS NOTIFICATION   Patient Details  Name: Kaitlyn Ramirez MRN: [id number] Date of Birth: [birth year]   Medicare Observation Status Notification Given:  Yes    [name] 02/19/2024, 10:59 AM

## 2024-02-20 DIAGNOSIS — F419 Anxiety disorder, unspecified: Secondary | ICD-10-CM | POA: Diagnosis not present

## 2024-02-20 DIAGNOSIS — Z471 Aftercare following joint replacement surgery: Secondary | ICD-10-CM | POA: Diagnosis not present

## 2024-02-20 DIAGNOSIS — I480 Paroxysmal atrial fibrillation: Secondary | ICD-10-CM | POA: Diagnosis not present

## 2024-02-20 DIAGNOSIS — Z96641 Presence of right artificial hip joint: Secondary | ICD-10-CM | POA: Diagnosis not present

## 2024-02-20 DIAGNOSIS — I10 Essential (primary) hypertension: Secondary | ICD-10-CM | POA: Diagnosis not present

## 2024-02-20 DIAGNOSIS — K2100 Gastro-esophageal reflux disease with esophagitis, without bleeding: Secondary | ICD-10-CM | POA: Diagnosis not present

## 2024-02-20 DIAGNOSIS — E782 Mixed hyperlipidemia: Secondary | ICD-10-CM | POA: Diagnosis not present

## 2024-02-20 DIAGNOSIS — D649 Anemia, unspecified: Secondary | ICD-10-CM | POA: Diagnosis not present

## 2024-02-20 DIAGNOSIS — E785 Hyperlipidemia, unspecified: Secondary | ICD-10-CM | POA: Diagnosis not present

## 2024-02-22 DIAGNOSIS — E785 Hyperlipidemia, unspecified: Secondary | ICD-10-CM | POA: Diagnosis not present

## 2024-02-22 DIAGNOSIS — E782 Mixed hyperlipidemia: Secondary | ICD-10-CM | POA: Diagnosis not present

## 2024-02-22 DIAGNOSIS — K2100 Gastro-esophageal reflux disease with esophagitis, without bleeding: Secondary | ICD-10-CM | POA: Diagnosis not present

## 2024-02-22 DIAGNOSIS — D649 Anemia, unspecified: Secondary | ICD-10-CM | POA: Diagnosis not present

## 2024-02-22 DIAGNOSIS — Z471 Aftercare following joint replacement surgery: Secondary | ICD-10-CM | POA: Diagnosis not present

## 2024-02-22 DIAGNOSIS — Z96641 Presence of right artificial hip joint: Secondary | ICD-10-CM | POA: Diagnosis not present

## 2024-02-22 DIAGNOSIS — F419 Anxiety disorder, unspecified: Secondary | ICD-10-CM | POA: Diagnosis not present

## 2024-02-22 DIAGNOSIS — I10 Essential (primary) hypertension: Secondary | ICD-10-CM | POA: Diagnosis not present

## 2024-02-22 DIAGNOSIS — I480 Paroxysmal atrial fibrillation: Secondary | ICD-10-CM | POA: Diagnosis not present

## 2024-02-25 DIAGNOSIS — E782 Mixed hyperlipidemia: Secondary | ICD-10-CM | POA: Diagnosis not present

## 2024-02-25 DIAGNOSIS — D649 Anemia, unspecified: Secondary | ICD-10-CM | POA: Diagnosis not present

## 2024-02-25 DIAGNOSIS — K2100 Gastro-esophageal reflux disease with esophagitis, without bleeding: Secondary | ICD-10-CM | POA: Diagnosis not present

## 2024-02-25 DIAGNOSIS — I480 Paroxysmal atrial fibrillation: Secondary | ICD-10-CM | POA: Diagnosis not present

## 2024-02-25 DIAGNOSIS — E785 Hyperlipidemia, unspecified: Secondary | ICD-10-CM | POA: Diagnosis not present

## 2024-02-25 DIAGNOSIS — F419 Anxiety disorder, unspecified: Secondary | ICD-10-CM | POA: Diagnosis not present

## 2024-02-25 DIAGNOSIS — Z471 Aftercare following joint replacement surgery: Secondary | ICD-10-CM | POA: Diagnosis not present

## 2024-02-25 DIAGNOSIS — Z96641 Presence of right artificial hip joint: Secondary | ICD-10-CM | POA: Diagnosis not present

## 2024-02-25 DIAGNOSIS — I10 Essential (primary) hypertension: Secondary | ICD-10-CM | POA: Diagnosis not present

## 2024-02-28 DIAGNOSIS — E785 Hyperlipidemia, unspecified: Secondary | ICD-10-CM | POA: Diagnosis not present

## 2024-02-28 DIAGNOSIS — E782 Mixed hyperlipidemia: Secondary | ICD-10-CM | POA: Diagnosis not present

## 2024-02-28 DIAGNOSIS — K2100 Gastro-esophageal reflux disease with esophagitis, without bleeding: Secondary | ICD-10-CM | POA: Diagnosis not present

## 2024-02-28 DIAGNOSIS — F419 Anxiety disorder, unspecified: Secondary | ICD-10-CM | POA: Diagnosis not present

## 2024-02-28 DIAGNOSIS — I10 Essential (primary) hypertension: Secondary | ICD-10-CM | POA: Diagnosis not present

## 2024-02-28 DIAGNOSIS — Z471 Aftercare following joint replacement surgery: Secondary | ICD-10-CM | POA: Diagnosis not present

## 2024-02-28 DIAGNOSIS — I480 Paroxysmal atrial fibrillation: Secondary | ICD-10-CM | POA: Diagnosis not present

## 2024-02-28 DIAGNOSIS — D649 Anemia, unspecified: Secondary | ICD-10-CM | POA: Diagnosis not present

## 2024-02-28 DIAGNOSIS — Z96641 Presence of right artificial hip joint: Secondary | ICD-10-CM | POA: Diagnosis not present

## 2024-03-03 DIAGNOSIS — F419 Anxiety disorder, unspecified: Secondary | ICD-10-CM | POA: Diagnosis not present

## 2024-03-03 DIAGNOSIS — K2100 Gastro-esophageal reflux disease with esophagitis, without bleeding: Secondary | ICD-10-CM | POA: Diagnosis not present

## 2024-03-03 DIAGNOSIS — I10 Essential (primary) hypertension: Secondary | ICD-10-CM | POA: Diagnosis not present

## 2024-03-03 DIAGNOSIS — E782 Mixed hyperlipidemia: Secondary | ICD-10-CM | POA: Diagnosis not present

## 2024-03-03 DIAGNOSIS — D649 Anemia, unspecified: Secondary | ICD-10-CM | POA: Diagnosis not present

## 2024-03-03 DIAGNOSIS — Z96641 Presence of right artificial hip joint: Secondary | ICD-10-CM | POA: Diagnosis not present

## 2024-03-03 DIAGNOSIS — I480 Paroxysmal atrial fibrillation: Secondary | ICD-10-CM | POA: Diagnosis not present

## 2024-03-03 DIAGNOSIS — Z471 Aftercare following joint replacement surgery: Secondary | ICD-10-CM | POA: Diagnosis not present

## 2024-03-03 DIAGNOSIS — E785 Hyperlipidemia, unspecified: Secondary | ICD-10-CM | POA: Diagnosis not present

## 2024-03-11 DIAGNOSIS — Z96641 Presence of right artificial hip joint: Secondary | ICD-10-CM | POA: Diagnosis not present

## 2024-03-11 DIAGNOSIS — Z471 Aftercare following joint replacement surgery: Secondary | ICD-10-CM | POA: Diagnosis not present

## 2024-03-11 DIAGNOSIS — K2100 Gastro-esophageal reflux disease with esophagitis, without bleeding: Secondary | ICD-10-CM | POA: Diagnosis not present

## 2024-03-11 DIAGNOSIS — I480 Paroxysmal atrial fibrillation: Secondary | ICD-10-CM | POA: Diagnosis not present

## 2024-03-11 DIAGNOSIS — E785 Hyperlipidemia, unspecified: Secondary | ICD-10-CM | POA: Diagnosis not present

## 2024-03-11 DIAGNOSIS — F419 Anxiety disorder, unspecified: Secondary | ICD-10-CM | POA: Diagnosis not present

## 2024-03-11 DIAGNOSIS — I10 Essential (primary) hypertension: Secondary | ICD-10-CM | POA: Diagnosis not present

## 2024-03-11 DIAGNOSIS — E782 Mixed hyperlipidemia: Secondary | ICD-10-CM | POA: Diagnosis not present

## 2024-03-11 DIAGNOSIS — D649 Anemia, unspecified: Secondary | ICD-10-CM | POA: Diagnosis not present

## 2024-03-19 DIAGNOSIS — D649 Anemia, unspecified: Secondary | ICD-10-CM | POA: Diagnosis not present

## 2024-03-19 DIAGNOSIS — K2100 Gastro-esophageal reflux disease with esophagitis, without bleeding: Secondary | ICD-10-CM | POA: Diagnosis not present

## 2024-03-19 DIAGNOSIS — Z96641 Presence of right artificial hip joint: Secondary | ICD-10-CM | POA: Diagnosis not present

## 2024-03-19 DIAGNOSIS — F419 Anxiety disorder, unspecified: Secondary | ICD-10-CM | POA: Diagnosis not present

## 2024-03-19 DIAGNOSIS — I480 Paroxysmal atrial fibrillation: Secondary | ICD-10-CM | POA: Diagnosis not present

## 2024-03-19 DIAGNOSIS — Z471 Aftercare following joint replacement surgery: Secondary | ICD-10-CM | POA: Diagnosis not present

## 2024-03-19 DIAGNOSIS — I10 Essential (primary) hypertension: Secondary | ICD-10-CM | POA: Diagnosis not present

## 2024-03-19 DIAGNOSIS — E785 Hyperlipidemia, unspecified: Secondary | ICD-10-CM | POA: Diagnosis not present

## 2024-03-19 DIAGNOSIS — E782 Mixed hyperlipidemia: Secondary | ICD-10-CM | POA: Diagnosis not present

## 2024-03-21 DIAGNOSIS — Z471 Aftercare following joint replacement surgery: Secondary | ICD-10-CM | POA: Diagnosis not present

## 2024-03-21 DIAGNOSIS — F419 Anxiety disorder, unspecified: Secondary | ICD-10-CM | POA: Diagnosis not present

## 2024-03-21 DIAGNOSIS — Z96641 Presence of right artificial hip joint: Secondary | ICD-10-CM | POA: Diagnosis not present

## 2024-03-21 DIAGNOSIS — I10 Essential (primary) hypertension: Secondary | ICD-10-CM | POA: Diagnosis not present

## 2024-03-21 DIAGNOSIS — I480 Paroxysmal atrial fibrillation: Secondary | ICD-10-CM | POA: Diagnosis not present

## 2024-03-21 DIAGNOSIS — K2100 Gastro-esophageal reflux disease with esophagitis, without bleeding: Secondary | ICD-10-CM | POA: Diagnosis not present

## 2024-03-21 DIAGNOSIS — D649 Anemia, unspecified: Secondary | ICD-10-CM | POA: Diagnosis not present

## 2024-03-21 DIAGNOSIS — E785 Hyperlipidemia, unspecified: Secondary | ICD-10-CM | POA: Diagnosis not present

## 2024-03-21 DIAGNOSIS — E782 Mixed hyperlipidemia: Secondary | ICD-10-CM | POA: Diagnosis not present

## 2024-03-27 DIAGNOSIS — I10 Essential (primary) hypertension: Secondary | ICD-10-CM | POA: Diagnosis not present

## 2024-03-27 DIAGNOSIS — E785 Hyperlipidemia, unspecified: Secondary | ICD-10-CM | POA: Diagnosis not present

## 2024-03-27 DIAGNOSIS — D649 Anemia, unspecified: Secondary | ICD-10-CM | POA: Diagnosis not present

## 2024-03-27 DIAGNOSIS — F419 Anxiety disorder, unspecified: Secondary | ICD-10-CM | POA: Diagnosis not present

## 2024-03-27 DIAGNOSIS — K2100 Gastro-esophageal reflux disease with esophagitis, without bleeding: Secondary | ICD-10-CM | POA: Diagnosis not present

## 2024-03-27 DIAGNOSIS — Z96641 Presence of right artificial hip joint: Secondary | ICD-10-CM | POA: Diagnosis not present

## 2024-03-27 DIAGNOSIS — I480 Paroxysmal atrial fibrillation: Secondary | ICD-10-CM | POA: Diagnosis not present

## 2024-03-27 DIAGNOSIS — E782 Mixed hyperlipidemia: Secondary | ICD-10-CM | POA: Diagnosis not present

## 2024-03-27 DIAGNOSIS — Z471 Aftercare following joint replacement surgery: Secondary | ICD-10-CM | POA: Diagnosis not present

## 2024-03-31 DIAGNOSIS — E785 Hyperlipidemia, unspecified: Secondary | ICD-10-CM | POA: Diagnosis not present

## 2024-03-31 DIAGNOSIS — I10 Essential (primary) hypertension: Secondary | ICD-10-CM | POA: Diagnosis not present

## 2024-03-31 DIAGNOSIS — I480 Paroxysmal atrial fibrillation: Secondary | ICD-10-CM | POA: Diagnosis not present

## 2024-03-31 DIAGNOSIS — D649 Anemia, unspecified: Secondary | ICD-10-CM | POA: Diagnosis not present

## 2024-03-31 DIAGNOSIS — Z471 Aftercare following joint replacement surgery: Secondary | ICD-10-CM | POA: Diagnosis not present

## 2024-03-31 DIAGNOSIS — Z96641 Presence of right artificial hip joint: Secondary | ICD-10-CM | POA: Diagnosis not present

## 2024-03-31 DIAGNOSIS — K2100 Gastro-esophageal reflux disease with esophagitis, without bleeding: Secondary | ICD-10-CM | POA: Diagnosis not present

## 2024-03-31 DIAGNOSIS — E782 Mixed hyperlipidemia: Secondary | ICD-10-CM | POA: Diagnosis not present

## 2024-03-31 DIAGNOSIS — F419 Anxiety disorder, unspecified: Secondary | ICD-10-CM | POA: Diagnosis not present

## 2024-04-01 DIAGNOSIS — Z96641 Presence of right artificial hip joint: Secondary | ICD-10-CM | POA: Diagnosis not present

## 2024-04-03 DIAGNOSIS — E782 Mixed hyperlipidemia: Secondary | ICD-10-CM | POA: Diagnosis not present

## 2024-04-03 DIAGNOSIS — Z79899 Other long term (current) drug therapy: Secondary | ICD-10-CM | POA: Diagnosis not present

## 2024-04-10 DIAGNOSIS — D509 Iron deficiency anemia, unspecified: Secondary | ICD-10-CM | POA: Diagnosis not present

## 2024-04-10 DIAGNOSIS — K219 Gastro-esophageal reflux disease without esophagitis: Secondary | ICD-10-CM | POA: Diagnosis not present

## 2024-04-10 DIAGNOSIS — Z79899 Other long term (current) drug therapy: Secondary | ICD-10-CM | POA: Diagnosis not present

## 2024-04-10 DIAGNOSIS — Z96641 Presence of right artificial hip joint: Secondary | ICD-10-CM | POA: Diagnosis not present

## 2024-05-13 DIAGNOSIS — Z23 Encounter for immunization: Secondary | ICD-10-CM | POA: Diagnosis not present

## 2024-05-13 DIAGNOSIS — D500 Iron deficiency anemia secondary to blood loss (chronic): Secondary | ICD-10-CM | POA: Diagnosis not present

## 2024-05-17 ENCOUNTER — Emergency Department
Admission: EM | Admit: 2024-05-17 | Discharge: 2024-05-17 | Disposition: A | Discharge status: Home / Self Care | Attending: Emergency Medicine | Admitting: Emergency Medicine

## 2024-05-17 ENCOUNTER — Emergency Department

## 2024-05-17 DIAGNOSIS — H43391 Other vitreous opacities, right eye: Secondary | ICD-10-CM | POA: Diagnosis not present

## 2024-05-17 DIAGNOSIS — I10 Essential (primary) hypertension: Secondary | ICD-10-CM | POA: Insufficient documentation

## 2024-05-17 DIAGNOSIS — H538 Other visual disturbances: Secondary | ICD-10-CM | POA: Diagnosis not present

## 2024-05-17 LAB — COMPREHENSIVE METABOLIC PANEL WITH GFR
ALT: 11 U/L (ref 0–44)
AST: 20 U/L (ref 15–41)
Albumin: 3.8 g/dL (ref 3.5–5.0)
Alkaline Phosphatase: 62 U/L (ref 38–126)
Anion gap: 11 (ref 5–15)
BUN: 23 mg/dL (ref 8–23)
CO2: 23 mmol/L (ref 22–32)
Calcium: 9 mg/dL (ref 8.9–10.3)
Chloride: 107 mmol/L (ref 98–111)
Creatinine, Ser: 1.1 mg/dL — ABNORMAL HIGH (ref 0.44–1.00)
GFR, Estimated: 52 mL/min — ABNORMAL LOW (ref >60)
Glucose, Bld: 99 mg/dL (ref 70–99)
Potassium: 3.8 mmol/L (ref 3.5–5.1)
Sodium: 141 mmol/L (ref 135–145)
Total Bilirubin: 0.4 mg/dL (ref 0.0–1.2)
Total Protein: 7.2 g/dL (ref 6.5–8.1)

## 2024-05-17 LAB — CBC
HCT: 35.4 % — ABNORMAL LOW (ref 36.0–46.0)
Hemoglobin: 11.3 g/dL — ABNORMAL LOW (ref 12.0–15.0)
MCH: 28 pg (ref 26.0–34.0)
MCHC: 31.9 g/dL (ref 30.0–36.0)
MCV: 87.6 fL (ref 80.0–100.0)
Platelets: 226 K/uL (ref 150–400)
RBC: 4.04 MIL/uL (ref 3.87–5.11)
RDW: 12.9 % (ref 11.5–15.5)
WBC: 3.8 K/uL — ABNORMAL LOW (ref 4.0–10.5)
nRBC: 0 % (ref 0.0–0.2)

## 2024-05-17 MED ORDER — TETRACAINE HCL 0.5 % OP SOLN
2.0000 [drp] | Freq: Once | OPHTHALMIC | Status: DC
  Filled 2024-05-17: qty 4

## 2024-05-17 MED ORDER — FLUORESCEIN SODIUM 1 MG OP STRP
1.0000 | ORAL_STRIP | Freq: Once | OPHTHALMIC | Status: DC
  Filled 2024-05-17: qty 1

## 2024-05-17 NOTE — ED Triage Notes (Addendum)
 Pt to ED via POV from home. Pt reports has been having constant floaters and blurry vision in her right eye x2 wks. Has not been seen for this before. Denies HA or dizziness. Pt reports hx of cataract surgery on right eye. Denies blood thinner. Pt spoke with eye doctor and concerned for retinal detachment

## 2024-05-17 NOTE — ED Notes (Signed)
 Pt completely assessed by EDP and set to discharge. No RN assessment performed.  Pt provided discharge instructions and prescription information. Pt was given the opportunity to ask questions and questions were answered.

## 2024-05-17 NOTE — ED Provider Notes (Signed)
 [facility] Provider Note    Event Date/Time   First MD Initiated Contact with Patient 05/17/24 1706     (approximate)   History   Chief Complaint Eye Problem   HPI  Kaitlyn Ramirez is a 78 y.o. female with past medical history of hypertension, hyperlipidemia, anemia, GERD, SVT, and glaucoma who presents to the ED complaining of eye problem.  Patient reports that she has had about 1 week of floaters in her right eye as well as some mild blurry vision in this eye.  She states that the left eye is unaffected and it does not seem to affect a particular visual field.  She denies any pain, swelling, or drainage from her right eye.  She follows with [facility] for glaucoma, spoke to them earlier today and they recommended she come to the ED for evaluation.  She denies any speech changes, numbness or weakness in her extremities.     Physical Exam   Triage Vital Signs: ED Triage Vitals [05/17/24 1634]  Encounter Vitals Group     BP (!) 165/80     Girls Systolic BP Percentile      Girls Diastolic BP Percentile      Boys Systolic BP Percentile      Boys Diastolic BP Percentile      Pulse Rate 70     Resp 18     Temp 98.1 F (36.7 C)     Temp Source Oral     SpO2 100 %     Weight      Height      Head Circumference      Peak Flow      Pain Score 0     Pain Loc      Pain Education      Exclude from Growth Chart     Most recent vital signs: Vitals:   05/17/24 1634  BP: (!) 165/80  Pulse: 70  Resp: 18  Temp: 98.1 F (36.7 C)  SpO2: 100%    Constitutional: Alert and oriented. Eyes: Conjunctivae are normal.  Pupils equal, round, and reactive to light bilaterally.  Extraocular movements intact.  No fluorescein uptake noted. Head: Atraumatic. Nose: No congestion/rhinnorhea. Mouth/Throat: Mucous membranes are moist.  Cardiovascular: Normal rate, regular rhythm. Grossly normal heart sounds.  2+ radial pulses bilaterally. Respiratory: Normal  respiratory effort.  No retractions. Lungs CTAB. Gastrointestinal: Soft and nontender. No distention. Musculoskeletal: No lower extremity tenderness nor edema.  Neurologic:  Normal speech and language. No gross focal neurologic deficits are appreciated.    ED Results / Procedures / Treatments   Labs (all labs ordered are listed, but only abnormal results are displayed) Labs Reviewed  COMPREHENSIVE METABOLIC PANEL WITH GFR - Abnormal; Notable for the following components:      Result Value   Creatinine, Ser 1.10 (*)    GFR, Estimated 52 (*)    All other components within normal limits  CBC - Abnormal; Notable for the following components:   WBC 3.8 (*)    Hemoglobin 11.3 (*)    HCT 35.4 (*)    All other components within normal limits     EKG  ED ECG REPORT I, Carlin Palin, the attending physician, personally viewed and interpreted this ECG.   Date: 05/17/2024  EKG Time: 16:37  Rate: 62  Rhythm: normal sinus rhythm  Axis: Normal  Intervals:none  ST&T Change: None  RADIOLOGY CT head reviewed and interpreted by me with no hemorrhage or  midline shift.  PROCEDURES:  Critical Care performed: No  Procedures   MEDICATIONS ORDERED IN ED: Medications  fluorescein ophthalmic strip 1 strip (has no administration in time range)  tetracaine (PONTOCAINE) 0.5 % ophthalmic solution 2 drop (has no administration in time range)     IMPRESSION / MDM / ASSESSMENT AND PLAN / ED COURSE  I reviewed the triage vital signs and the nursing notes.                              78 y.o. female with past medical history of hypertension, hyperlipidemia, anemia, GERD, SVT, and glaucoma who presents to the ED complaining of floaters in her right eye with blurry vision in this eye for the past week.  Patient's presentation is most consistent with acute presentation with potential threat to life or bodily function.  Differential diagnosis includes, but is not limited to, stroke, TIA,  retinal detachment, vitreous detachment, vitreous hemorrhage, glaucoma.  Patient well-appearing and in no acute distress, vital signs are unremarkable.  She complains only of floaters and blurry vision in her right eye, has a nonfocal neurologic exam.  CT head is negative for acute process and I doubt stroke as the source of her symptoms, ophthalmologic pathology seems much more likely.  Eye exam is unremarkable, patient with visual acuity of 20/25 in each eye.  No fluorescein uptake noted and IOP noted to be 23 on right and 21 on left.  Labs with mild AKI but no significant anemia, leukocytosis, lactate abnormality.  Findings reviewed with Dr. Jaye of ophthalmology, recommends patient follow-up at the [facility] in the next 2 days.  Patient counseled to return to the ED for new or worsening symptoms, patient agrees with plan.      FINAL CLINICAL IMPRESSION(S) / ED DIAGNOSES   Final diagnoses:  Vitreous floaters of right eye     Rx / DC Orders   ED Discharge Orders     None        Note:  This document was prepared using Dragon voice recognition software and may include unintentional dictation errors.   Willo Dunnings, MD 05/17/24 [phone]

## 2024-05-19 DIAGNOSIS — H43813 Vitreous degeneration, bilateral: Secondary | ICD-10-CM | POA: Diagnosis not present

## 2024-05-19 DIAGNOSIS — H401132 Primary open-angle glaucoma, bilateral, moderate stage: Secondary | ICD-10-CM | POA: Diagnosis not present

## 2024-05-19 DIAGNOSIS — H2512 Age-related nuclear cataract, left eye: Secondary | ICD-10-CM | POA: Diagnosis not present

## 2024-05-20 DIAGNOSIS — Z1231 Encounter for screening mammogram for malignant neoplasm of breast: Secondary | ICD-10-CM | POA: Diagnosis not present

## 2024-05-27 DIAGNOSIS — H40033 Anatomical narrow angle, bilateral: Secondary | ICD-10-CM | POA: Diagnosis not present

## 2024-05-27 DIAGNOSIS — H2512 Age-related nuclear cataract, left eye: Secondary | ICD-10-CM | POA: Diagnosis not present

## 2024-05-27 DIAGNOSIS — Z961 Presence of intraocular lens: Secondary | ICD-10-CM | POA: Diagnosis not present
